# Patient Record
Sex: Female | Born: 1950 | Race: Black or African American | Hispanic: No | Smoking: Former smoker
Health system: Southern US, Community
[De-identification: ages and names within clinical notes are randomized; demographics above are authoritative.]

## PROBLEM LIST (undated history)

## (undated) DIAGNOSIS — D869 Sarcoidosis, unspecified: Secondary | ICD-10-CM

## (undated) DIAGNOSIS — E7800 Pure hypercholesterolemia, unspecified: Secondary | ICD-10-CM

## (undated) DIAGNOSIS — T7840XA Allergy, unspecified, initial encounter: Secondary | ICD-10-CM

## (undated) DIAGNOSIS — J939 Pneumothorax, unspecified: Secondary | ICD-10-CM

## (undated) HISTORY — DX: Allergy, unspecified, initial encounter: T78.40XA

## (undated) HISTORY — PX: LUNG SURGERY: SHX703

## (undated) HISTORY — DX: Pure hypercholesterolemia, unspecified: E78.00

## (undated) HISTORY — PX: TUBAL LIGATION: SHX77

---

## 2001-02-06 ENCOUNTER — Emergency Department (HOSPITAL_COMMUNITY): Admission: EM | Admit: 2001-02-06 | Discharge: 2001-02-06 | Payer: Self-pay | Admitting: Emergency Medicine

## 2002-02-25 ENCOUNTER — Emergency Department (HOSPITAL_COMMUNITY): Admission: EM | Admit: 2002-02-25 | Discharge: 2002-02-25 | Payer: Self-pay | Admitting: Emergency Medicine

## 2002-02-25 ENCOUNTER — Encounter: Payer: Self-pay | Admitting: Emergency Medicine

## 2004-07-26 ENCOUNTER — Other Ambulatory Visit: Admission: RE | Admit: 2004-07-26 | Discharge: 2004-07-26 | Payer: Self-pay | Admitting: Family Medicine

## 2004-08-26 ENCOUNTER — Encounter: Admission: RE | Admit: 2004-08-26 | Discharge: 2004-08-26 | Payer: Self-pay | Admitting: Family Medicine

## 2007-05-31 ENCOUNTER — Emergency Department (HOSPITAL_COMMUNITY): Admission: EM | Admit: 2007-05-31 | Discharge: 2007-05-31 | Payer: Self-pay | Admitting: Family Medicine

## 2007-06-03 ENCOUNTER — Emergency Department (HOSPITAL_COMMUNITY): Admission: EM | Admit: 2007-06-03 | Discharge: 2007-06-03 | Payer: Self-pay | Admitting: Family Medicine

## 2009-05-30 ENCOUNTER — Emergency Department (HOSPITAL_COMMUNITY): Admission: EM | Admit: 2009-05-30 | Discharge: 2009-05-30 | Payer: Self-pay | Admitting: Emergency Medicine

## 2009-07-26 ENCOUNTER — Emergency Department (HOSPITAL_COMMUNITY): Admission: EM | Admit: 2009-07-26 | Discharge: 2009-07-26 | Payer: Self-pay | Admitting: Family Medicine

## 2009-12-05 ENCOUNTER — Emergency Department (HOSPITAL_COMMUNITY): Admission: EM | Admit: 2009-12-05 | Discharge: 2009-12-05 | Payer: Self-pay | Admitting: Family Medicine

## 2009-12-05 ENCOUNTER — Emergency Department (HOSPITAL_COMMUNITY): Admission: EM | Admit: 2009-12-05 | Discharge: 2009-12-05 | Payer: Self-pay | Admitting: Emergency Medicine

## 2009-12-10 ENCOUNTER — Emergency Department (HOSPITAL_COMMUNITY): Admission: EM | Admit: 2009-12-10 | Discharge: 2009-12-11 | Payer: Self-pay | Admitting: Emergency Medicine

## 2009-12-13 ENCOUNTER — Emergency Department (HOSPITAL_COMMUNITY): Admission: EM | Admit: 2009-12-13 | Discharge: 2009-12-13 | Payer: Self-pay | Admitting: Family Medicine

## 2009-12-16 ENCOUNTER — Emergency Department (HOSPITAL_COMMUNITY): Admission: EM | Admit: 2009-12-16 | Discharge: 2009-12-16 | Payer: Self-pay | Admitting: Family Medicine

## 2010-11-30 LAB — URINALYSIS, ROUTINE W REFLEX MICROSCOPIC
Glucose, UA: NEGATIVE mg/dL
Ketones, ur: NEGATIVE mg/dL
Leukocytes, UA: NEGATIVE
Protein, ur: NEGATIVE mg/dL
Urobilinogen, UA: 0.2 mg/dL (ref 0.0–1.0)
pH: 5 (ref 5.0–8.0)

## 2010-11-30 LAB — CBC
HCT: 34.8 % — ABNORMAL LOW (ref 36.0–46.0)
Hemoglobin: 12 g/dL (ref 12.0–15.0)
Platelets: 177 10*3/uL (ref 150–400)
RBC: 3.73 MIL/uL — ABNORMAL LOW (ref 3.87–5.11)
WBC: 6.3 10*3/uL (ref 4.0–10.5)

## 2010-11-30 LAB — COMPREHENSIVE METABOLIC PANEL
AST: 17 U/L (ref 0–37)
BUN: 13 mg/dL (ref 6–23)
CO2: 27 mEq/L (ref 19–32)
Calcium: 9.7 mg/dL (ref 8.4–10.5)
Chloride: 113 mEq/L — ABNORMAL HIGH (ref 96–112)
Creatinine, Ser: 0.77 mg/dL (ref 0.4–1.2)
GFR calc Af Amer: >60 mL/min (ref >60)
GFR calc non Af Amer: >60 mL/min (ref >60)
Glucose, Bld: 94 mg/dL (ref 70–99)

## 2010-11-30 LAB — POCT I-STAT, CHEM 8
BUN: 16 mg/dL (ref 6–23)
Chloride: 111 mEq/L (ref 96–112)
Glucose, Bld: 93 mg/dL (ref 70–99)
HCT: 36 % (ref 36.0–46.0)
Hemoglobin: 12.2 g/dL (ref 12.0–15.0)
Potassium: 4 mEq/L (ref 3.5–5.1)
Sodium: 143 mEq/L (ref 135–145)

## 2010-11-30 LAB — DIFFERENTIAL
Basophils Relative: 1 % (ref 0–1)
Neutro Abs: 3.2 10*3/uL (ref 1.7–7.7)

## 2010-11-30 LAB — URINE MICROSCOPIC-ADD ON

## 2010-12-05 LAB — URINALYSIS, ROUTINE W REFLEX MICROSCOPIC
Glucose, UA: NEGATIVE mg/dL
Specific Gravity, Urine: 1.008 (ref 1.005–1.030)

## 2010-12-05 LAB — COMPREHENSIVE METABOLIC PANEL
ALT: 14 U/L (ref 0–35)
Alkaline Phosphatase: 73 U/L (ref 39–117)
CO2: 27 mEq/L (ref 19–32)
Chloride: 105 mEq/L (ref 96–112)
Creatinine, Ser: 0.73 mg/dL (ref 0.4–1.2)
Sodium: 141 mEq/L (ref 135–145)
Total Bilirubin: 1 mg/dL (ref 0.3–1.2)
Total Protein: 7 g/dL (ref 6.0–8.3)

## 2010-12-05 LAB — URINE MICROSCOPIC-ADD ON

## 2010-12-05 LAB — CBC
Platelets: 203 10*3/uL (ref 150–400)
RBC: 4.17 MIL/uL (ref 3.87–5.11)
RDW: 14.1 % (ref 11.5–15.5)

## 2010-12-05 LAB — GLUCOSE, CAPILLARY

## 2015-07-20 ENCOUNTER — Encounter (HOSPITAL_COMMUNITY): Payer: Self-pay | Admitting: Emergency Medicine

## 2015-07-20 ENCOUNTER — Emergency Department (INDEPENDENT_AMBULATORY_CARE_PROVIDER_SITE_OTHER): Payer: Self-pay

## 2015-07-20 ENCOUNTER — Emergency Department (HOSPITAL_COMMUNITY)
Admission: EM | Admit: 2015-07-20 | Discharge: 2015-07-20 | Disposition: A | Discharge status: Home / Self Care | Payer: Self-pay | Source: Home / Self Care | Attending: Emergency Medicine | Admitting: Emergency Medicine

## 2015-07-20 DIAGNOSIS — J40 Bronchitis, not specified as acute or chronic: Secondary | ICD-10-CM

## 2015-07-20 HISTORY — DX: Pneumothorax, unspecified: J93.9

## 2015-07-20 HISTORY — DX: Sarcoidosis, unspecified: D86.9

## 2015-07-20 MED ORDER — AZITHROMYCIN 250 MG PO TABS: 250.0000 mg | ORAL_TABLET | Freq: Every day | ORAL | Status: DC

## 2015-07-20 MED ORDER — PREDNISONE 50 MG PO TABS: 50.0000 mg | ORAL_TABLET | Freq: Every day | ORAL | Status: DC

## 2015-07-20 MED ORDER — ALBUTEROL SULFATE HFA 108 (90 BASE) MCG/ACT IN AERS: 1.0000 | INHALATION_SPRAY | Freq: Four times a day (QID) | RESPIRATORY_TRACT | Status: DC | PRN

## 2015-07-20 MED ORDER — HYDROCOD POLST-CPM POLST ER 10-8 MG/5ML PO SUER: 5.0000 mL | Freq: Two times a day (BID) | ORAL | Status: DC | PRN

## 2015-07-20 NOTE — ED Notes (Signed)
Complains of chest congestion, phlegm is brown tinged.  Patient complains of deep cough with burning in chest, burning worse with coughing, complains of headache, hot and cold spells and feels very tired.

## 2015-07-20 NOTE — ED Provider Notes (Signed)
HPI  SUBJECTIVE:  Deanna Knight is a 64 y.o. female who presents with cough, chest congestion, nasal congestion, postnasal drip, fatigue, chest soreness from coughing, wheezing, shortness of breath due to coughing for one week. States the cough is productive of brownish sputum. Symptoms are better with rest, worse with lying down and she has tried Mucinex and DayQuil. She denies nausea, vomiting, fevers although she reports feeling feverish with lightheaded. She states that she is coughing all night unable to sleep. States that she is getting worse, not better. She denies sinus pain/pressure. She reports left ear pain" popping". She just finished amoxicillin one week ago for dental problem. She did not get a flu shot this year. Past medical history sarcoidosis, bronchitis, pneumothorax. She is a smoker. No history of diabetes, asthma, emphysema, COPD, hypertension.    Past Medical History  Diagnosis Date  . Sarcoidosis ([place])   . Pneumothorax     Past Surgical History  Procedure Laterality Date  . Lung surgery      No family history on file.  Social History  Substance Use Topics  . Smoking status: Current Every Day Smoker  . Smokeless tobacco: None  . Alcohol Use: Yes    No current facility-administered medications for this encounter.  Current outpatient prescriptions:  .  albuterol (PROVENTIL HFA;VENTOLIN HFA) 108 (90 BASE) MCG/ACT inhaler, Inhale 1-2 puffs into the lungs every 6 (six) hours as needed for wheezing or shortness of breath. Dispense with aerochamber, Disp: 1 Inhaler, Rfl: 0 .  azithromycin (ZITHROMAX) 250 MG tablet, Take 1 tablet (250 mg total) by mouth daily. 2 tabs po on day one, then one tablet po once daily on days 2-5., Disp: 6 tablet, Rfl: 0 .  chlorpheniramine-HYDROcodone (TUSSIONEX PENNKINETIC ER) 10-8 MG/5ML SUER, Take 5 mLs by mouth every 12 (twelve) hours as needed for cough., Disp: 120 mL, Rfl: 0 .  predniSONE (DELTASONE) 50 MG tablet, Take 1  tablet (50 mg total) by mouth daily with breakfast. X 5 days, Disp: 5 tablet, Rfl: 0  Allergies  Allergen Reactions  . Sulfa Antibiotics      ROS  As noted in HPI.   Physical Exam  BP 135/88 mmHg  Pulse 69  Temp(Src) 99 F (37.2 C) (Oral)  Resp 18  SpO2 97%  Constitutional: Well developed, well nourished, no acute distress Eyes:  EOMI, conjunctiva normal bilaterally HENT: Normocephalic, atraumatic,mucus membranes moist. Bilateral TMs normal. Large cerumen plug left ear. Hearing grossly normal bilaterally. Positive nasal congestion, erythematous swollen turbinates, no sinus tenderness. Normal oropharynx, + postnasal drip. Respiratory: Normal inspiratory effort good air movement no rales wheezing rhonchi Cardiovascular: Normal rate, regular rhythm no murmurs rubs gallops GI: nondistended skin: No rash, skin intact Musculoskeletal: no deformities Neurologic: Alert & oriented x 3, no focal neuro deficits Psychiatric: Speech and behavior appropriate   ED Course   Medications - No data to display  Orders Placed This Encounter  Procedures  . DG Chest 2 View    Standing Status: Standing     Number of Occurrences: 1     Standing Expiration Date:     Order Specific Question:  Reason for Exam (SYMPTOM  OR DIAGNOSIS REQUIRED)    Answer:  cough x 1 week h/o sarcoidosis, ptx r/o PNA, ptx, etc  . DG Chest 1 View    Standing Status: Standing     Number of Occurrences: 1     Standing Expiration Date:     Order Specific Question:  Reason for Exam (SYMPTOM  OR DIAGNOSIS REQUIRED)    Answer:  mass R upper chest - repeat xr    No results found for this or any previous visit (from the past 24 hour(s)). Dg Chest 1 View  07/20/2015  CLINICAL DATA:  Exclude hair artifact. EXAM: CHEST 1 VIEW COMPARISON:  07/20/2015 at 17:31 p.m. FINDINGS: Lungs are clear. Cardiomediastinal silhouette is within normal. Previously noted opacity overlying the posterior right first rib/ right neck base is  no longer present and was related to patient's hair. IMPRESSION: No active disease. Electronically Signed   By: Marin Olp M.D.   On: 07/20/2015 19:18   Dg Chest 2 View  07/20/2015  CLINICAL DATA:  Chest pain and burning for 1 week cough shortness of breath EXAM: CHEST  2 VIEW COMPARISON:  12/05/2009 FINDINGS: The heart size and vascular pattern are normal. Lungs are clear. No pleural effusion. There is hyper attenuating 2 cm focus of irregularity projecting over the posterior right first rib. IMPRESSION: Abnormal hyper attenuating possibly calcified lesion projecting over posterior right first rib. Differential diagnostic possibilities include callus from prior rib fracture, mass involving the ribs or chest wall, or possibly extraneous object on the patient's skin. Hair projects over the patient's more cranial cervical soft tissues. Recommend repeating the radiograph with attention to the patient's hair. If this finding persists, CT thorax would be suggested. Electronically Signed   By: Skipper Cliche M.D.   On: 07/20/2015 18:00    ED Clinical Impression  Bronchitis   ED Assessment/Plan  Upper right chest Mass noted on initial x-ray. Repeated x-ray, no mass noted. No pneumonia, pneumothorax. See radiology report for details.  Feel that left ear popping is from cerumen impaction- no evidence of otitis-  advised Debrox, peroxide/warm water irrigation. Home with albuterol and prednisone, Tussionex, wait-and-see prescription azithromycin if she does not get better after finishing the prednisone. Giving antibiotics as patient has history of structural lung disease and duration of symptoms. Discussed imaging, MDM, plan and followup with patient. Discussed sn/sx that should prompt return to the UC or ED. Patient agrees with plan.   *This clinic note was created using Dragon dictation software. Therefore, there may be occasional mistakes despite careful proofreading.  ?   Melynda Ripple,  MD 07/20/15 1924

## 2015-07-20 NOTE — Discharge Instructions (Signed)
Due to puffs from your albuterol inhaler every 4-6 hours as needed for cough. Continue the Mucinex to help keep mucous secretions thin. Cough syrup as needed for cough. Finish the steroids. Start the azithromycin if you're not getting better after finishing the steroids or if you start running fevers above 100.4. Be sure to buy a thermometer to monitor your temperature at home

## 2016-09-19 ENCOUNTER — Emergency Department (HOSPITAL_COMMUNITY)
Admission: EM | Admit: 2016-09-19 | Discharge: 2016-09-19 | Disposition: A | Discharge status: Home / Self Care | Payer: Medicare Other | Attending: Emergency Medicine | Admitting: Emergency Medicine

## 2016-09-19 ENCOUNTER — Encounter (HOSPITAL_COMMUNITY): Payer: Self-pay | Admitting: Emergency Medicine

## 2016-09-19 ENCOUNTER — Emergency Department (HOSPITAL_COMMUNITY): Payer: Medicare Other

## 2016-09-19 DIAGNOSIS — R05 Cough: Secondary | ICD-10-CM | POA: Diagnosis not present

## 2016-09-19 DIAGNOSIS — R0789 Other chest pain: Secondary | ICD-10-CM | POA: Insufficient documentation

## 2016-09-19 DIAGNOSIS — F17200 Nicotine dependence, unspecified, uncomplicated: Secondary | ICD-10-CM | POA: Diagnosis not present

## 2016-09-19 DIAGNOSIS — R059 Cough, unspecified: Secondary | ICD-10-CM

## 2016-09-19 DIAGNOSIS — Z79899 Other long term (current) drug therapy: Secondary | ICD-10-CM | POA: Insufficient documentation

## 2016-09-19 DIAGNOSIS — R079 Chest pain, unspecified: Secondary | ICD-10-CM | POA: Diagnosis not present

## 2016-09-19 LAB — BASIC METABOLIC PANEL
Anion gap: 8 (ref 5–15)
BUN: 15 mg/dL (ref 6–20)
CALCIUM: 9.6 mg/dL (ref 8.9–10.3)
CO2: 22 mmol/L (ref 22–32)
CREATININE: 0.7 mg/dL (ref 0.44–1.00)
Chloride: 108 mmol/L (ref 101–111)
GFR calc Af Amer: >60 mL/min (ref >60)
GLUCOSE: 95 mg/dL (ref 65–99)
POTASSIUM: 3.7 mmol/L (ref 3.5–5.1)
SODIUM: 138 mmol/L (ref 135–145)

## 2016-09-19 LAB — CBC
HEMATOCRIT: 43.3 % (ref 36.0–46.0)
Hemoglobin: 14.5 g/dL (ref 12.0–15.0)
MCH: 31.5 pg (ref 26.0–34.0)
MCHC: 33.5 g/dL (ref 30.0–36.0)
MCV: 94.1 fL (ref 78.0–100.0)
PLATELETS: 219 10*3/uL (ref 150–400)
RBC: 4.6 MIL/uL (ref 3.87–5.11)
RDW: 13.5 % (ref 11.5–15.5)
WBC: 6.9 10*3/uL (ref 4.0–10.5)

## 2016-09-19 LAB — I-STAT TROPONIN, ED: TROPONIN I, POC: 0 ng/mL (ref 0.00–0.08)

## 2016-09-19 MED ORDER — PREDNISONE 20 MG PO TABS
60.0000 mg | ORAL_TABLET | ORAL | Status: AC
  Administered 2016-09-19: 60 mg via ORAL
  Filled 2016-09-19: qty 3

## 2016-09-19 MED ORDER — ALBUTEROL SULFATE HFA 108 (90 BASE) MCG/ACT IN AERS
2.0000 | INHALATION_SPRAY | Freq: Four times a day (QID) | RESPIRATORY_TRACT | Status: DC
  Administered 2016-09-19: 2 via RESPIRATORY_TRACT
  Filled 2016-09-19: qty 6.7

## 2016-09-19 MED ORDER — PREDNISONE 20 MG PO TABS: 40.0000 mg | ORAL_TABLET | Freq: Every day | ORAL | 0 refills | Status: DC

## 2016-09-19 NOTE — Discharge Instructions (Signed)
As discussed, your evaluation today has been largely reassuring.  But, it is important that you monitor your condition carefully, and do not hesitate to return to the ED if you develop new, or concerning changes in your condition.  Otherwise, please follow-up with your physician for appropriate ongoing care.  You symptoms are likely due to irritation of the chest wall, possibly bronchitis.  Please take prednisone as prescribed and use the provided inhaler every four hours for the next two days (you may use it as needed after that).

## 2016-09-19 NOTE — ED Triage Notes (Signed)
Pt c/o burning chest pain since Friday, also c/o cough.

## 2016-09-19 NOTE — Discharge Planning (Signed)
Pt up for discharge. EDCM reviewed chart for possible CM needs.  No needs identified or communicated.  

## 2016-09-19 NOTE — ED Provider Notes (Signed)
[facility] DEPT Provider Note   CSN: [id number] Arrival date & time: 09/19/16  [id number]  By signing my name below, I, Soijett Blue, attest that this documentation has been prepared under the direction and in the presence of Carmin Muskrat, MD. Electronically Signed: Soijett Blue, ED Scribe. 09/19/16. 1:00 PM.  History   Chief Complaint Chief Complaint  Patient presents with  . Chest Pain    HPI Deanna Knight is a 66 y.o. female with a PMHx of pneumothorax, sarcoidosis, who presents to the Emergency Department complaining of non-exertional, burning, right-sided, CP onset 4 days ago. Pt notes that she was sick as well as she took down her christmas decorations prior to the onset of her symptoms. She is having associated symptoms of exertional SOB, productive cough, nausea, and loose stools. She has tried albuterol inhaler, ASA, theraflu, and mucinex, with no relief for her symptoms. She denies fever, vomiting, diarrhea, and any other symptoms. Pt notes that she used to smoke and notes that she is still around second-hand smoke. 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 Denies having a PCP.   The history is provided by the patient. No language interpreter was used.    Past Medical History:  Diagnosis Date  . Pneumothorax   . Sarcoidosis ([place])     There are no active problems to display for this patient.   Past Surgical History:  Procedure Laterality Date  . LUNG SURGERY      OB History    No data available       Home Medications    Prior to Admission medications   Medication Sig Start Date End Date Taking? Authorizing Provider  albuterol (PROVENTIL HFA;VENTOLIN HFA) 108 (90 BASE) MCG/ACT inhaler Inhale 1-2 puffs into the lungs every 6 (six) hours as needed for wheezing or shortness of breath. Dispense with aerochamber 07/20/15   Melynda Ripple, MD  azithromycin (ZITHROMAX) 250 MG tablet Take 1 tablet (250 mg total) by mouth daily. 2 tabs po on day one, then  one tablet po once daily on days 2-5. 07/20/15   Melynda Ripple, MD  chlorpheniramine-HYDROcodone [facility] ER) 10-8 MG/5ML SUER Take 5 mLs by mouth every 12 (twelve) hours as needed for cough. 07/20/15   Melynda Ripple, MD  predniSONE (DELTASONE) 50 MG tablet Take 1 tablet (50 mg total) by mouth daily with breakfast. X 5 days 07/20/15   Melynda Ripple, MD    Family History No family history on file.  Social History Social History  Substance Use Topics  . Smoking status: Current Every Day Smoker  . Smokeless tobacco: Not on file  . Alcohol use Yes     Allergies   Sulfa antibiotics   Review of Systems Review of Systems  Constitutional:       Per HPI, otherwise negative  HENT:       Per HPI, otherwise negative  Respiratory:       Per HPI, otherwise negative  Cardiovascular: Positive for chest pain.       Per HPI, otherwise negative  Gastrointestinal: Negative for vomiting.  Endocrine:       Negative aside from HPI  Genitourinary:       Neg aside from HPI   Musculoskeletal:       Per HPI, otherwise negative  Skin: Negative.   Neurological: Negative for syncope.     Physical Exam Updated Vital Signs BP 125/75 (BP Location: Right Arm)   Pulse 90   Temp 98.4 F (36.9 C) (Oral)  Resp 18   Ht 5\' 7"  (1.702 m)   Wt 135 lb (61.2 kg)   SpO2 94%   BMI 21.14 kg/m   Physical Exam  Constitutional: She is oriented to person, place, and time. She appears well-developed and well-nourished. No distress.  HENT:  Head: Normocephalic and atraumatic.  Eyes: Conjunctivae and EOM are normal.  Cardiovascular: Normal rate and regular rhythm.   Pulmonary/Chest: Effort normal. No stridor. No respiratory distress.  Diminished breath sounds bilaterally  Abdominal: She exhibits no distension.  Musculoskeletal: She exhibits no edema.  Neurological: She is alert and oriented to person, place, and time. No cranial nerve deficit.  Skin: Skin is warm and dry.    Psychiatric: She has a normal mood and affect.  Nursing note and vitals reviewed.   ED Treatments / Results  DIAGNOSTIC STUDIES: Oxygen Saturation is 94% on RA, adequate by my interpretation.    COORDINATION OF CARE: 12:46 PM Discussed treatment plan with pt at bedside which includes labs, CXR, EKG, steroid Rx, albuterol inhaler, consult to case management, and pt agreed to plan.   Labs (all labs ordered are listed, but only abnormal results are displayed) Labs Reviewed  BASIC METABOLIC PANEL  CBC  I-STAT [place], ED    EKG  EKG Interpretation  Date/Time:  Tuesday September 19 2016 08:44:42 EST Ventricular Rate:  67 PR Interval:  112 QRS Duration: 78 QT Interval:  388 QTC Calculation: 409 R Axis:   79 Text Interpretation:  Normal sinus rhythm ST-t wave abnormality Baseline wander Abnormal ekg Confirmed by Carmin Muskrat  MD [phone]) on 09/19/2016 12:28:37 PM       Radiology Dg Chest 2 View  Result Date: 09/19/2016 CLINICAL DATA:  Chest pain several days EXAM: CHEST  2 VIEW COMPARISON:  07/20/2015 FINDINGS: The heart size and mediastinal contours are within normal limits. Both lungs are clear. The visualized skeletal structures are unremarkable. IMPRESSION: No active cardiopulmonary disease. Electronically Signed   By: Inez Catalina M.D.   On: 09/19/2016 09:26    Procedures Procedures (including critical care time)  Medications Ordered in ED Medications  albuterol (PROVENTIL HFA;VENTOLIN HFA) 108 (90 Base) MCG/ACT inhaler 2 puff (not administered)  predniSONE (DELTASONE) tablet 60 mg (not administered)    Initial Impression / Assessment and Plan / ED Course  I have reviewed the triage vital signs and the nursing notes.  Pertinent labs & imaging results that were available during my care of the patient were reviewed by me and considered in my medical decision making (see chart for details).  Clinical Course    Well appearing F p/w days of cough, sternal and  R-sided CP.  No e/o ACS, PNA, PTX, and no VS c/w PE.  Sx may be 2/2 bronchitis or musculoskeletal etiology.  Patient started on steroids, scheduled albuterol (former smoker) and d/w CM to provided f/u appt.    I personally performed the services described in this documentation, which was scribed in my presence. The recorded information has been reviewed and is accurate.      Carmin Muskrat, MD 09/19/16 1322

## 2016-10-11 ENCOUNTER — Ambulatory Visit (INDEPENDENT_AMBULATORY_CARE_PROVIDER_SITE_OTHER): Payer: Medicare Other | Admitting: Family Medicine

## 2016-10-11 ENCOUNTER — Encounter: Payer: Self-pay | Admitting: Family Medicine

## 2016-10-11 VITALS — BP 131/65 | HR 66 | Temp 97.5°F | Resp 18 | Ht 67.0 in | Wt 140.0 lb

## 2016-10-11 DIAGNOSIS — Z114 Encounter for screening for human immunodeficiency virus [HIV]: Secondary | ICD-10-CM | POA: Diagnosis not present

## 2016-10-11 DIAGNOSIS — Z1231 Encounter for screening mammogram for malignant neoplasm of breast: Secondary | ICD-10-CM

## 2016-10-11 DIAGNOSIS — Z1211 Encounter for screening for malignant neoplasm of colon: Secondary | ICD-10-CM

## 2016-10-11 DIAGNOSIS — E7800 Pure hypercholesterolemia, unspecified: Secondary | ICD-10-CM

## 2016-10-11 DIAGNOSIS — H538 Other visual disturbances: Secondary | ICD-10-CM

## 2016-10-11 DIAGNOSIS — R634 Abnormal weight loss: Secondary | ICD-10-CM

## 2016-10-11 DIAGNOSIS — Z1159 Encounter for screening for other viral diseases: Secondary | ICD-10-CM

## 2016-10-11 DIAGNOSIS — Z23 Encounter for immunization: Secondary | ICD-10-CM | POA: Diagnosis not present

## 2016-10-11 DIAGNOSIS — T7840XS Allergy, unspecified, sequela: Secondary | ICD-10-CM

## 2016-10-11 DIAGNOSIS — R739 Hyperglycemia, unspecified: Secondary | ICD-10-CM | POA: Diagnosis not present

## 2016-10-11 DIAGNOSIS — Z1239 Encounter for other screening for malignant neoplasm of breast: Secondary | ICD-10-CM

## 2016-10-11 LAB — CBC WITH DIFFERENTIAL/PLATELET
BASOS PCT: 0 %
Basophils Absolute: 0 cells/uL (ref 0–200)
EOS ABS: 116 {cells}/uL (ref 15–500)
Eosinophils Relative: 2 %
HCT: 39.2 % (ref 35.0–45.0)
Hemoglobin: 12.8 g/dL (ref 11.7–15.5)
Lymphocytes Relative: 32 %
Lymphs Abs: 1856 cells/uL (ref 850–3900)
MCH: 30.9 pg (ref 27.0–33.0)
MCHC: 32.7 g/dL (ref 32.0–36.0)
MCV: 94.7 fL (ref 80.0–100.0)
MONOS PCT: 5 %
MPV: 11.1 fL (ref 7.5–12.5)
Monocytes Absolute: 290 cells/uL (ref 200–950)
NEUTROS ABS: 3538 {cells}/uL (ref 1500–7800)
Neutrophils Relative %: 61 %
PLATELETS: 207 10*3/uL (ref 140–400)
RBC: 4.14 MIL/uL (ref 3.80–5.10)
RDW: 14.1 % (ref 11.0–15.0)
WBC: 5.8 10*3/uL (ref 3.8–10.8)

## 2016-10-11 LAB — COMPLETE METABOLIC PANEL WITH GFR
ALT: 17 U/L (ref 6–29)
AST: 19 U/L (ref 10–35)
Albumin: 3.8 g/dL (ref 3.6–5.1)
Alkaline Phosphatase: 68 U/L (ref 33–130)
BUN: 14 mg/dL (ref 7–25)
CO2: 24 mmol/L (ref 20–31)
Calcium: 9.2 mg/dL (ref 8.6–10.4)
Chloride: 110 mmol/L (ref 98–110)
Creat: 0.77 mg/dL (ref 0.50–0.99)
GFR, EST NON AFRICAN AMERICAN: 81 mL/min (ref >=60)
Glucose, Bld: 79 mg/dL (ref 65–99)
Potassium: 4.2 mmol/L (ref 3.5–5.3)
Sodium: 144 mmol/L (ref 135–146)
Total Bilirubin: 0.3 mg/dL (ref 0.2–1.2)
Total Protein: 6.6 g/dL (ref 6.1–8.1)

## 2016-10-11 LAB — POCT GLYCOSYLATED HEMOGLOBIN (HGB A1C): HEMOGLOBIN A1C: 5.2

## 2016-10-11 MED ORDER — ZOSTER VACCINE LIVE 19400 UNT/0.65ML ~~LOC~~ SUSR: 0.6500 mL | Freq: Once | SUBCUTANEOUS | 0 refills | Status: AC

## 2016-10-11 MED ORDER — CETIRIZINE HCL 10 MG PO TABS: 10.0000 mg | ORAL_TABLET | Freq: Every day | ORAL | 11 refills | Status: DC

## 2016-10-11 NOTE — Progress Notes (Signed)
Subjective:    Patient ID: Deanna Knight, female    DOB: Apr 26, 1951, 66 y.o.   MRN: [id number]  HPI Ms. Deanna Knight, a 66 year old female presents to establish care. She was a patient of [employer], but has been lost to follow up over the past several years. Patient has a history of elevated cholesterol and a family history of type 2 diabetes mellitus. She is following a low fat, low carbohydrate diet. She is very active.   Patient denies chest pain, claudication, dyspnea, fatigue, irregular heart beat, near-syncope, orthopnea, palpitations, syncope and tachypnea.  Cardiovascular risk factors  advanced age (older than 27 for men, 41 for women) and dyslipidemia.   Patient is also complaining of a rash that occurs periodically when she is exposed to chlorine. 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 She says that she gets an itchy rash, primarily to lower extremities when exposed. She denies new lotions, soaps, laundry detergents or new pets. She has not attempted any OTC interventions to assist with problem.  Past Medical History:  Diagnosis Date  . Hypercholesteremia   . Pneumothorax   . Sarcoidosis ([place])    Immunization History  Administered Date(s) Administered  . Influenza,inj,Quad PF,36+ Mos 10/11/2016  . Pneumococcal Conjugate-13 10/11/2016   Review of Systems  Constitutional: Negative.  Negative for fatigue, fever and unexpected weight change.  HENT: Negative.   Eyes: Positive for photophobia and visual disturbance (blurred vision to right eye).  Respiratory: Negative.   Cardiovascular: Negative.   Gastrointestinal: Negative.   Endocrine: Negative.  Negative for polydipsia, polyphagia and polyuria.  Genitourinary: Negative.   Musculoskeletal: Negative.   Skin: Negative.   Allergic/Immunologic: Negative.   Neurological: Negative.   Hematological: Negative.   Psychiatric/Behavioral: Negative.         Objective:   Physical Exam  Constitutional: She is oriented to person, place, and time. She appears well-developed and well-nourished.  HENT:  Head: Normocephalic and atraumatic.  Right Ear: External ear normal.  Left Ear: External ear normal.  Mouth/Throat: Oropharynx is clear and moist.  Eyes: Conjunctivae and EOM are normal. Pupils are equal, round, and reactive to light.  Neck: Normal range of motion. Neck supple.  Cardiovascular: Normal rate, regular rhythm, normal heart sounds and intact distal pulses.   Pulmonary/Chest: Effort normal and breath sounds normal.  Abdominal: Soft. Bowel sounds are normal.  Musculoskeletal: Normal range of motion.  Neurological: She is alert and oriented to person, place, and time. She has normal reflexes.  Skin: Skin is warm and dry.  Psychiatric: She has a normal mood and affect. Her behavior is normal. Judgment and thought content normal.      BP 131/65 (BP Location: Left Arm, Patient Position: Sitting, Cuff Size: Normal)   Pulse 66   Temp 97.5 F (36.4 C)   Resp 18   Ht 5\' 7"  (1.702 m)   Wt 140 lb (63.5 kg)   SpO2 100%   BMI 21.93 kg/m  Assessment & Plan:  1. Immunization due - Pneumococcal conjugate vaccine 13-valent - Zoster Vaccine Live, PF, (ZOSTAVAX) 16109 UNT/0.65ML injection; Inject 19,400 Units into the skin once.  Dispense: 1 each; Refill: 0  2. Hypercholesterolemia - Lipid Panel; Future - COMPLETE METABOLIC PANEL WITH GFR  3. Weight loss Recommend 2000 calorie  diet divided over 5-6 small meals, increase water intake to 6-8 glasses, and 150 minutes per week of cardiovascular exercise.    4. Hyperglycemia -  HgB A1c - COMPLETE METABOLIC PANEL WITH GFR - CBC with Differential  5. Allergic state, sequela - cetirizine (ZYRTEC) 10 MG tablet; Take 1 tablet (10 mg total) by mouth daily.  Dispense: 30 tablet; Refill: 11  6. Need for influenza vaccination - Flu Vaccine QUAD 36+ mos PF IM (Fluarix & Fluzone Quad PF)  7.  Screening for breast cancer - MM Digital Screening; Future  8. Blurred vision, left eye - Ambulatory referral to Ophthalmology  9. Screening for HIV (human immunodeficiency virus) - HIV antibody (with reflex)  10. Colon cancer screening  - Ambulatory referral to Gastroenterology    RTC: 3 months for pap smear   Dorena Dew, FNP

## 2016-10-11 NOTE — Patient Instructions (Addendum)
Allergies, Adult An allergy is when your body's defense system (immune system) overreacts to an otherwise harmless substance (allergen) that you breathe in or eat or something that touches your skin. When you come into contact with something that you are allergic to, your immune system produces certain proteins (antibodies). These proteins cause cells to release chemicals (histamines) that trigger the symptoms of an allergic reaction. Allergies often affect the nasal passages (allergic rhinitis), eyes (allergic conjunctivitis), skin (atopic dermatitis), and stomach. Allergies can be mild or severe. Allergies cannot spread from person to person (are not contagious). They can develop at any age and may be outgrown. What are the causes? Allergies can be caused by any substance that your immune system mistakenly targets as harmful. These may include:  Outdoor allergens, such as pollen, grass, weeds, car exhaust, and mold spores.  Indoor allergens, such as dust, smoke, mold, and pet dander.  Foods, especially peanuts, milk, eggs, fish, shellfish, soy, nuts, and wheat.  Medicines, such as penicillin.  Skin irritants, such as detergents, chemicals, and latex.  Perfume.  Insect bites or stings. What increases the risk? You may be at greater risk of allergies if other people in your family have allergies. What are the signs or symptoms? Symptoms depend on what type of allergy you have. They may include:  Runny, stuffy nose.  Sneezing.  Itchy mouth, ears, or throat.  Postnasal drip.  Sore throat.  Itchy, red, watery, or puffy eyes.  Skin rash or hives.  Stomach pain.  Vomiting.  Diarrhea.  Bloating.  Wheezing or coughing. People with a severe allergy to food, medicine, or an insect bite may have a life-threatening allergic reaction (anaphylaxis). Symptoms of anaphylaxis include:  Hives.  Itching.  Flushed face.  Swollen lips, tongue, or mouth.  Tight or swollen  throat.  Chest pain or tightness in the chest.  Trouble breathing or shortness of breath.  Rapid heartbeat.  Dizziness or fainting.  Vomiting.  Diarrhea.  Pain in the abdomen. How is this diagnosed? This condition is diagnosed based on:  Your symptoms.  Your family and medical history.  A physical exam. You may need to see a health care provider who specializes in treating allergies (allergist). You may also have tests, including:  Skin tests to see which allergens are causing your symptoms, such as:  Skin prick test. In this test, your skin is pricked with a tiny needle and exposed to small amounts of possible allergens to see if your skin reacts.  Intradermal skin test. In this test, a small amount of allergen is injected under your skin to see if your skin reacts.  Patch test. In this test, a small amount of allergen is placed on your skin and then your skin is covered with a bandage. Your health care provider will check your skin after a couple of days to see if a rash has developed.  Blood tests.  Challenges tests. In this test, you inhale a small amount of allergen by mouth to see if you have an allergic reaction. You may also be asked to:  Keep a food diary. A food diary is a record of all the foods and drinks you have in a day and any symptoms you experience.  Practice an elimination diet. An elimination diet involves eliminating specific foods from your diet and then adding them back in one by one to find out if a certain food causes an allergic reaction. How is this treated? Treatment for allergies depends on your symptoms.   Treatment may include:  Cold compresses to soothe itching and swelling.  Eye drops.  Nasal sprays.  Using a saline spray or container (neti pot) to flush out the nose (nasal irrigation). These methods can help clear away mucus and keep the nasal passages moist.  Using a humidifier.  Oral antihistamines or other medicines to block  allergic reaction and inflammation.  Skin creams to treat rashes or itching.  Diet changes to eliminate food allergy triggers.  Repeated exposure to tiny amounts of allergens to build up a tolerance and prevent future allergic reactions (immunotherapy). These include:  Allergy shots.  Oral treatment. This involves taking small doses of an allergen under the tongue (sublingual immunotherapy).  Emergency epinephrine injection (auto-injector) in case of an allergic emergency. This is a self-injectable, pre-measured medicine that must be given within the first few minutes of a serious allergic reaction. Follow these instructions at home:  Avoid known allergens whenever possible.  If you suffer from airborne allergens, wash out your nose daily. You can do this with a saline spray or a neti pot to flush out your nose (nasal irrigation).  Take over-the-counter and prescription medicines only as told by your health care provider.  Keep all follow-up visits as told by your health care provider. This is important.  If you are at risk of a severe allergic reaction (anaphylaxis), keep your auto-injector with you at all times.  If you have ever had anaphylaxis, wear a medical alert bracelet or necklace that states you have a severe allergy. Contact a health care provider if:  Your symptoms do not improve with treatment. Get help right away if:  You have symptoms of anaphylaxis, such as:  Swollen mouth, tongue, or throat.  Pain or tightness in your chest.  Trouble breathing or shortness of breath.  Dizziness or fainting.  Severe abdominal pain, vomiting, or diarrhea. This information is not intended to replace advice given to you by your health care provider. Make sure you discuss any questions you have with your health care provider. Document Released: 11/21/2002 Document Revised: 04/27/2016 Document Reviewed: 03/15/2016 Elsevier Interactive Patient Education  2017 [employer] Having blurred vision means that you cannot see things clearly. Your vision may seem fuzzy or out of focus. Blurred vision is a very common symptom of an eye or vision problem. Blurred vision is often a gradual blur that occurs in one eye or both eyes. There are many causes of blurred vision, including cataracts, macular degeneration, and diabetic retinopathy. Blurred vision can be diagnosed based on your symptoms and a physical exam. Tell your health care provider about any other health problems you have, any recent eye injury, and any prior surgeries. You may need to see a health care provider who specializes in eye problems (ophthalmologist). Your treatment depends on what is causing your blurred vision.  HOME CARE INSTRUCTIONS  Tell your health care provider about any changes in your blurred vision.  Do not drive or operate heavy machinery if your vision is blurry.  Keep all follow-up visits as directed by your health care provider. This is important. SEEK MEDICAL CARE IF:  Your symptoms get worse.  You have new symptoms.  You have trouble seeing at night.  You have trouble seeing up close or far away.  You have trouble noticing the difference between colors. SEEK IMMEDIATE MEDICAL CARE IF:  You have severe eye pain.  You have a severe headache.  You have flashing lights in your field of vision.  You have a sudden change in vision.  You have a sudden loss of vision.  You have vision change after an injury.  You notice drainage coming from your eyes.  You notice a rash around your eyes. This information is not intended to replace advice given to you by your health care provider. Make sure you discuss any questions you have with your health care provider. Document Released: 08/31/2003 Document Revised: 01/12/2015 Document Reviewed: 07/22/2014 Elsevier Interactive Patient Education  2017 [place].  High Cholesterol High cholesterol is a condition  in which the blood has high levels of a white, waxy, fat-like substance (cholesterol). The human body needs small amounts of cholesterol. The liver makes all the cholesterol that the body needs. Extra (excess) cholesterol comes from the food that we eat. Cholesterol is carried from the liver by the blood through the blood vessels. If you have high cholesterol, deposits (plaques) may build up on the walls of your blood vessels (arteries). Plaques make the arteries narrower and stiffer. Cholesterol plaques increase your risk for heart attack and stroke. Work with your health care provider to keep your cholesterol levels in a healthy range. What increases the risk? This condition is more likely to develop in people who:  Eat foods that are high in animal fat (saturated fat) or cholesterol.  Are overweight.  Are not getting enough exercise.  Have a family history of high cholesterol. What are the signs or symptoms? There are no symptoms of this condition. How is this diagnosed? This condition may be diagnosed from the results of a blood test.  If you are older than age 54, your health care provider may check your cholesterol every 4-6 years.  You may be checked more often if you already have high cholesterol or other risk factors for heart disease. The blood test for cholesterol measures:  "Bad" cholesterol (LDL cholesterol). This is the main type of cholesterol that causes heart disease. The desired level for LDL is less than 100.  "Good" cholesterol (HDL cholesterol). This type helps to protect against heart disease by cleaning the arteries and carrying the LDL away. The desired level for HDL is 60 or higher.  Triglycerides. These are fats that the body can store or burn for energy. The desired number for triglycerides is lower than 150.  Total cholesterol. This is a measure of the total amount of cholesterol in your blood, including LDL cholesterol, HDL cholesterol, and triglycerides. A  healthy number is less than 200. How is this treated? This condition is treated with diet changes, lifestyle changes, and medicines. Diet changes  This may include eating more whole grains, fruits, vegetables, nuts, and fish.  This may also include cutting back on red meat and foods that have a lot of added sugar. Lifestyle changes  Changes may include getting at least 40 minutes of aerobic exercise 3 times a week. Aerobic exercises include walking, biking, and swimming. Aerobic exercise along with a healthy diet can help you maintain a healthy weight.  Changes may also include quitting smoking. Medicines  Medicines are usually given if diet and lifestyle changes have failed to reduce your cholesterol to healthy levels.  Your health care provider may prescribe a statin medicine. Statin medicines have been shown to reduce cholesterol, which can reduce the risk of heart disease. Follow these instructions at home: Eating and drinking If told by your health care provider:  Eat chicken (without skin), fish, veal, shellfish, ground [place] breast, and round or loin cuts of  red meat.  Do not eat fried foods or fatty meats, such as hot dogs and salami.  Eat plenty of fruits, such as apples.  Eat plenty of vegetables, such as broccoli, potatoes, and carrots.  Eat beans, peas, and lentils.  Eat grains such as barley, rice, couscous, and bulgur wheat.  Eat pasta without cream sauces.  Use skim or nonfat milk, and eat low-fat or nonfat yogurt and cheeses.  Do not eat or drink whole milk, cream, ice cream, egg yolks, or hard cheeses.  Do not eat stick margarine or tub margarines that contain trans fats (also called partially hydrogenated oils).  Do not eat saturated tropical oils, such as coconut oil and palm oil.  Do not eat cakes, cookies, crackers, or other baked goods that contain trans fats. General instructions  Exercise as directed by your health care provider. Increase your  activity level with activities such as gardening, walking, and taking the stairs.  Take over-the-counter and prescription medicines only as told by your health care provider.  Do not use any products that contain nicotine or tobacco, such as cigarettes and e-cigarettes. If you need help quitting, ask your health care provider.  Keep all follow-up visits as told by your health care provider. This is important. Contact a health care provider if:  You are struggling to maintain a healthy diet or weight.  You need help to start on an exercise program.  You need help to stop smoking. Get help right away if:  You have chest pain.  You have trouble breathing. This information is not intended to replace advice given to you by your health care provider. Make sure you discuss any questions you have with your health care provider. Document Released: 08/28/2005 Document Revised: 03/25/2016 Document Reviewed: 02/26/2016 Elsevier Interactive Patient Education  2017 [employer].

## 2016-10-12 LAB — HIV ANTIBODY (ROUTINE TESTING W REFLEX): HIV 1&2 Ab, 4th Generation: NONREACTIVE

## 2016-10-16 ENCOUNTER — Encounter: Payer: Self-pay | Admitting: Family Medicine

## 2017-01-09 ENCOUNTER — Encounter: Payer: Self-pay | Admitting: Family Medicine

## 2017-01-09 ENCOUNTER — Ambulatory Visit (INDEPENDENT_AMBULATORY_CARE_PROVIDER_SITE_OTHER): Payer: Medicare Other | Admitting: Family Medicine

## 2017-01-09 ENCOUNTER — Other Ambulatory Visit (HOSPITAL_COMMUNITY)
Admission: RE | Admit: 2017-01-09 | Discharge: 2017-01-09 | Disposition: A | Discharge status: Home / Self Care | Payer: Medicare Other | Source: Ambulatory Visit | Attending: Family Medicine | Admitting: Family Medicine

## 2017-01-09 VITALS — BP 107/88 | HR 62 | Temp 97.8°F | Resp 16 | Ht 67.0 in | Wt 137.0 lb

## 2017-01-09 DIAGNOSIS — Z01419 Encounter for gynecological examination (general) (routine) without abnormal findings: Secondary | ICD-10-CM | POA: Diagnosis not present

## 2017-01-09 DIAGNOSIS — Z1231 Encounter for screening mammogram for malignant neoplasm of breast: Secondary | ICD-10-CM | POA: Diagnosis not present

## 2017-01-09 DIAGNOSIS — Z1239 Encounter for other screening for malignant neoplasm of breast: Secondary | ICD-10-CM

## 2017-01-09 LAB — POCT URINALYSIS DIP (DEVICE)
Bilirubin Urine: NEGATIVE
Glucose, UA: NEGATIVE mg/dL
Ketones, ur: NEGATIVE mg/dL
Leukocytes, UA: NEGATIVE
NITRITE: NEGATIVE
PH: 5 (ref 5.0–8.0)
Protein, ur: NEGATIVE mg/dL
Specific Gravity, Urine: 1.025 (ref 1.005–1.030)
UROBILINOGEN UA: 0.2 mg/dL (ref 0.0–1.0)

## 2017-01-09 NOTE — Progress Notes (Addendum)
Subjective:    Patient ID: Deanna Knight, female    DOB: 1951-01-28, 66 y.o.   MRN: [id number]  Gynecologic Exam  The patient's pertinent negatives include no genital itching, genital lesions, genital odor, genital rash, missed menses, vaginal bleeding or vaginal discharge. Pertinent negatives include no fever. She is not sexually active. She uses nothing for contraception. Her past medical history is significant for miscarriage. There is no history of a Cesarean section, an ectopic pregnancy, endometriosis, herpes simplex, metrorrhagia, ovarian cysts, perineal abscess, PID, an STD, a terminated pregnancy or vaginosis.   Past Medical History:  Diagnosis Date  . Hypercholesteremia   . Pneumothorax   . Sarcoidosis    Immunization History  Administered Date(s) Administered  . Influenza,inj,Quad PF,36+ Mos 10/11/2016  . Pneumococcal Conjugate-13 10/11/2016   Social History   Social History  . Marital status: Divorced    Spouse name: N/A  . Number of children: N/A  . Years of education: N/A   Occupational History  . Not on file.   Social History Main Topics  . Smoking status: Former Smoker    Years: 1.00  . Smokeless tobacco: Former Systems developer  . Alcohol use Yes  . Drug use: No  . Sexual activity: No   Other Topics Concern  . Not on file   Social History Narrative  . No narrative on file   Review of Systems  Constitutional: Negative.  Negative for fatigue and fever.  HENT: Negative.   Eyes: Negative.   Respiratory: Negative.   Cardiovascular: Negative.  Negative for chest pain, palpitations and leg swelling.  Gastrointestinal: Negative.   Endocrine: Negative.   Genitourinary: Negative for missed menses and vaginal discharge.  Musculoskeletal: Negative.   Allergic/Immunologic: Negative.   Neurological: Negative.   Hematological: Negative.   Psychiatric/Behavioral: Negative.        Objective:   Physical Exam  Constitutional: She is oriented to person,  place, and time. She appears well-developed and well-nourished.  HENT:  Head: Normocephalic and atraumatic.  Right Ear: External ear normal.  Left Ear: External ear normal.  Nose: Nose normal.  Mouth/Throat: Oropharynx is clear and moist.  Eyes: Conjunctivae and EOM are normal. Pupils are equal, round, and reactive to light.  Neck: Normal range of motion. Neck supple.  Cardiovascular: Normal rate, normal heart sounds and intact distal pulses.   Pulmonary/Chest: Effort normal and breath sounds normal.  Abdominal: Soft. Bowel sounds are normal.  Genitourinary: Vagina normal and uterus normal. There is no rash, tenderness or lesion on the right labia. There is no rash, tenderness or lesion on the left labia. Cervix exhibits friability. Cervix exhibits no motion tenderness. No tenderness or bleeding in the vagina.  Musculoskeletal: Normal range of motion.  Neurological: She is alert and oriented to person, place, and time. She has normal reflexes.  Skin: Skin is warm and dry.  Psychiatric: She has a normal mood and affect. Her behavior is normal. Judgment and thought content normal.     BP 107/88 (BP Location: Right Arm, Patient Position: Sitting, Cuff Size: Normal)   Pulse 62   Temp 97.8 F (36.6 C) (Oral)   Resp 16   Ht 5\' 7"  (1.702 m)   Wt 137 lb (62.1 kg)   SpO2 100%   BMI 21.46 kg/m  Assessment & Plan:  1. Pap test, as part of routine gynecological examination - Cytology - PAP [place]  2. Breast cancer screening Patient to schedule a mammogram at [facility]  RTC: 6 months for medicare wellness exam  Preventative Health Maintenance: Recommend glaucoma screening Mammogram Colonoscopy Dexascan Fasting Lipid panel Patient is up to date with vaccinations  [name]  MSN, FNP-C [facility] [address] [place], [place] [address] [phone]

## 2017-01-09 NOTE — Patient Instructions (Addendum)
Breast Self-Awareness Breast self-awareness means:  Knowing how your breasts look.  Knowing how your breasts feel.  Checking your breasts every month for changes.  Telling your doctor if you notice a change in your breasts. Breast self-awareness allows you to notice a breast problem early while it is still small. How to do a breast self-exam One way to learn what is normal for your breasts and to check for changes is to do a breast self-exam. To do a breast self-exam: Look for Changes   1. Take off all the clothes above your waist. 2. Stand in front of a mirror in a room with good lighting. 3. Put your hands on your hips. 4. Push your hands down. 5. Look at your breasts and nipples in the mirror to see if one breast or nipple looks different than the other. Check to see if:  The shape of one breast is different.  The size of one breast is different.  There are wrinkles, dips, and bumps in one breast and not the other. 6. Look at each breast for changes in your skin, such as:  Redness.  Scaly areas. 7. Look for changes in your nipples, such as:  Liquid around the nipples.  Bleeding.  Dimpling.  Redness.  A change in where the nipples are. Feel for Changes  1. Lie on your back on the floor. 2. Feel each breast. To do this, follow these steps:  Pick a breast to feel.  Put the arm closest to that breast above your head.  Use your other arm to feel the nipple area of your breast. Feel the area with the pads of your three middle fingers by making small circles with your fingers. For the first circle, press lightly. For the second circle, press harder. For the third circle, press even harder.  Keep making circles with your fingers at the light, harder, and even harder pressures as you move down your breast. Stop when you feel your ribs.  Move your fingers a little toward the center of your body.  Start making circles with your fingers again, this time going up until  you reach your collarbone.  Keep making up and down circles until you reach your armpit. Remember to keep using the three pressures.  Feel the other breast in the same way. 3. Sit or stand in the shower or tub. 4. With soapy water on your skin, feel each breast the same way you did in step 2, when you were lying on the floor. Write Down What You Find   After doing the self-exam, write down:  What is normal for each breast.  Any changes you find in each breast.  When you last had your period. How often should I check my breasts? Check your breasts every month. If you are breastfeeding, the best time to check them is after you feed your baby or after you use a breast pump. If you get periods, the best time to check your breasts is 5-7 days after your period is over. When should I see my doctor? See your doctor if you notice:  A change in shape or size of your breasts or nipples.  A change in the skin of your breast or nipples, such as red or scaly skin.  Unusual fluid coming from your nipples.  A lump or thick area that was not there before.  Pain in your breasts.  Anything that concerns you. This information is not intended to replace advice given to  you by your health care provider. Make sure you discuss any questions you have with your health care provider. Document Released: 02/14/2008 Document Revised: 02/03/2016 Document Reviewed: 07/18/2015 Elsevier Interactive Patient Education  2017 [place] Screening for Women A cancer screening is a test or exam that checks for cancer. Your health care provider will recommend specific cancer screenings based on your age, personal history, and family history of cancer. Work with your health care provider to create a cancer screening schedule that protects your health. Why is cancer screening done? Cancer screening is done to look for cancer in the very early stages, before it spreads and becomes harder to treat and before  you would start to notice symptoms. Finding cancer early improves the chances of successful treatment. It may save your life. Who should be screened for cancer? All women should be screened for certain cancers, including breast cancer, cervical cancer, and skin cancer. Your health care provider may recommend screenings for other types of cancer if:  You had cancer before.  You have a family member with cancer.  You have abnormal genes that could increase the risk of cancer.  You have risk factors for certain cancers, such as smoking. When you should be screened for cancer depends on:  Your age.  Your medical history and your family's medical history.  Certain lifestyle factors, such as smoking.  Environmental exposure, such as to asbestos. What are some common cancer screenings? Breast cancer  Breast cancer screening is done with a test that takes images of breast tissue (mammogram). Here are some screening guidelines:  When you are age 77-44. you will be given the choice to start having mammograms.  When you are age 59-54, you should have a mammogram every year.  You may start having mammograms before age 20 if you have risk factors for breast cancer, such as having an immediate family member with breast cancer.  When you are age 89 or older, you should have a mammogram every 1-2 years for as long as you are in good health and have a life expectancy of 10 years or more.  It is important to know what your breasts look and feel like so you can report any changes to your health care provider. Cervical cancer  Cervical cancer screening is done with a Pap test. This testchecks for abnormalities, including the virus that causes cervical cancer (human papillomavirus, or HPV). To perform the test, a health care provider takes a swab of cervical cells during a pelvic exam. Screening for cervical cancer with a Pap test should start at age 64. Here are some screening guidelines:  When you  are age 49-29, you should have a Pap test every 3 years.  When you are age 46-65, you should have a Pap test and HPV test every 5 years or have a Pap test every 3 years.  You may be screened for cervical cancer more often if you have risk factors for cervical cancer.  If your Pap tests are abnormal, you may have an HPV test.  If you have had the HPV vaccine, you will still be screened for cervical cancer and follow normal screening recommendations. You do not need to be screened for cervical cancer if any of the following apply to you:  You are older than age 24 and you have not had a serious cervical precancer or cancer in the last 20 years.  Your cervix and uterus have been removed and you have never had cervical  cancer or precancerous cells. Endometrial cancer  There is no standard screening test for endometrial cancer, but the cancer can be detected with:  A test of a sample of tissue taken from the lining of the uterus (endometrial tissue biopsy).  A vaginal ultrasound.  Pap tests. If you are at increased risk for endometrial cancer, you may need to have these tests more often than normal. You are at increased risk if:  You have a family history of ovarian, uterine, or colon cancer.  You are taking tamoxifen, a drug that is used to treat breast cancer.  You have certain types of colon cancer. If you have reached menopause, it is especially important to talk with your health care provider about any vaginal bleeding or spotting. Screening for endometrial cancer is not recommended for women who do not have symptoms of the cancer, such as vaginal bleeding. Colorectal cancer  Screening for colorectal cancer is recommended starting at age 84 for most women. If you have a family history of colon or rectal cancer or other risk factors, you may need to start having screenings earlier. Talk with your health care provider about which screening test is right for you and how often you should  be screened. Colorectal cancer screening looks for cancer or for growths called polyps that often form before cancer starts. Tests to look for cancer or polyps include:  Colonoscopy or flexible sigmoidoscopy. For these procedures, a flexible tube with a small camera is inserted into the rectum.  CT colonography. This test uses X-rays and a contrast dye to check the colon for polyps. If a polyp is found, you may need to have a colonoscopy so the polyp can be located and removed. Tests to look for cancer in the stool (feces) include:  Guaiac-based fecal occult blood test (FOBT). This test detects blood in stool. It can be done at home with a kit.  Fecal immunochemical test (FIT). This test detects blood in stool. For this test, you will need to collect stool samples at home.  Stool DNA test. This test looks for blood in stool and any changes in DNA that can lead to colon cancer. For this test, you will need to collect a stool sample at home and send it to a lab. Skin cancer  Skin cancer screening is done by checking the skin for unusual moles or spots and any changes in existing moles. Your health care provider should check your skin for signs of skin cancer at every physical exam. You should check your skin every month and tell your health care provider right away if anything looks unusual. Women with a higher-than-normal risk for skin cancer may want to see a skin specialist (dermatologist) for an annual body check. Lung cancer  Lung cancer screening is done with a CT scan that looks for abnormal cells in the lungs. Discuss lung cancer screening with your health care provider if you are 41-4 years old and if any of the following apply to you:  You currently smoke.  You used to smoke heavily.  You have had at least a 30-pack-year smoking history.  You have quit smoking within the past 15 years. If you smoke heavily or if you used to smoke, you may need to be screened every year. Where to  find more information:  [place]: SkinPromotion.no  Centers for Disease Control and Prevention: http://knight-sullivan.biz/  Department of Health and Human Services: BankingDetective.si Contact a health care provider if:  You have concerns about  any signs or symptoms of cancer, such as:  Moles that have an unusual shape or color.  Changes in existing moles.  A sore on your skin that does not heal.  Blood in your stool.  Fatigue that does not go away.  Frequent pain or cramping in your abdomen.  Coughing, or coughing up blood.  Losing weight without trying.  Lumps or other changes in your breasts.  Vaginal bleeding, spotting, or changes in your periods. Summary  Be aware of and watch for signs and symptoms of cancer, especially symptoms of breast cancer, cervical cancer, endometrial cancer, colorectal cancer, skin cancer, and lung cancer.  Early detection of cancer with cancer screening may save your life.  Talk with your health care provider about your specific cancer risks.  Work together with your health care provider to create a cancer screening plan that is right for you. This information is not intended to replace advice given to you by your health care provider. Make sure you discuss any questions you have with your health care provider. Document Released: 05/25/2016 Document Revised: 05/25/2016 Document Reviewed: 05/25/2016 Elsevier Interactive Patient Education  2017 [employer].

## 2017-01-10 LAB — CYTOLOGY - PAP
BACTERIAL VAGINITIS: NEGATIVE
Candida vaginitis: NEGATIVE
Diagnosis: NEGATIVE

## 2017-01-11 ENCOUNTER — Telehealth: Payer: Self-pay

## 2017-01-11 NOTE — Telephone Encounter (Signed)
-----   Message from Dorena Dew, [place] sent at 01/10/2017  9:59 PM EDT ----- Regarding: lab results Please inform patient that pap smear is negative, will not need to repeat per current guidelines.   Thanks ----- Message ----- From: Interface, Lab In Three Zero One Sent: 01/10/2017   4:02 PM To: Dorena Dew, FNP

## 2017-01-11 NOTE — Telephone Encounter (Signed)
Tried to call patient. No answer and voicemail had not been set up. Will Try later. Thanks!

## 2017-01-12 NOTE — Telephone Encounter (Signed)
Called, no answer. Voicemail not set up . Will try later. Thanks!

## 2017-01-15 NOTE — Telephone Encounter (Signed)
Tried to call. No answer and voicemail not set up. Will mail letter tomorrow to advised of normal pap. Thanks!

## 2017-01-16 NOTE — Telephone Encounter (Signed)
I have mailed letter to patient since I have not been able to get in contact with by phone. Thanks!

## 2017-02-16 ENCOUNTER — Ambulatory Visit: Payer: 59 | Admitting: Family Medicine

## 2017-02-26 ENCOUNTER — Ambulatory Visit: Payer: 59 | Admitting: Family Medicine

## 2017-10-05 ENCOUNTER — Other Ambulatory Visit: Payer: Self-pay

## 2017-10-05 ENCOUNTER — Ambulatory Visit (INDEPENDENT_AMBULATORY_CARE_PROVIDER_SITE_OTHER): Payer: Medicare Other

## 2017-10-05 ENCOUNTER — Ambulatory Visit (HOSPITAL_COMMUNITY)
Admission: EM | Admit: 2017-10-05 | Discharge: 2017-10-05 | Disposition: A | Discharge status: Home / Self Care | Payer: Medicare Other | Attending: Family Medicine | Admitting: Family Medicine

## 2017-10-05 ENCOUNTER — Encounter (HOSPITAL_COMMUNITY): Payer: Self-pay | Admitting: Emergency Medicine

## 2017-10-05 DIAGNOSIS — R05 Cough: Secondary | ICD-10-CM | POA: Diagnosis not present

## 2017-10-05 DIAGNOSIS — J40 Bronchitis, not specified as acute or chronic: Secondary | ICD-10-CM

## 2017-10-05 MED ORDER — AMOXICILLIN 875 MG PO TABS: 875.0000 mg | ORAL_TABLET | Freq: Two times a day (BID) | ORAL | 0 refills | Status: DC

## 2017-10-05 MED ORDER — PREDNISONE 20 MG PO TABS: ORAL_TABLET | ORAL | 0 refills | Status: DC

## 2017-10-05 MED ORDER — HYDROCODONE-HOMATROPINE 5-1.5 MG/5ML PO SYRP: 5.0000 mL | ORAL_SOLUTION | Freq: Four times a day (QID) | ORAL | 0 refills | Status: DC | PRN

## 2017-10-05 NOTE — ED Triage Notes (Signed)
Pt c/o coughing x8 days, headache, congestion, ear pain.

## 2017-10-05 NOTE — ED Provider Notes (Signed)
[facility]   [id number] 10/05/17 Arrival Time: 5427   SUBJECTIVE:  Deanna Knight is a 67 y.o. female who presents to the urgent care with complaint of coughing x8 days, headache, congestion, ear pain.   She is a former smoker.  She is also had sarcoidosis and a pneumothorax.  Her symptoms of gotten worse today and been associated with a fever as well.  Patient works in a daycare center  Past Medical History:  Diagnosis Date  . Hypercholesteremia   . Pneumothorax   . Sarcoidosis    Family History  Problem Relation Age of Onset  . Stroke Mother   . Heart disease Mother   . Heart disease Father   . Stroke Sister   . Heart disease Sister   . Stroke Brother   . Heart disease Brother    Social History   Socioeconomic History  . Marital status: Divorced    Spouse name: Not on file  . Number of children: Not on file  . Years of education: Not on file  . Highest education level: Not on file  Social Needs  . Financial resource strain: Not on file  . Food insecurity - worry: Not on file  . Food insecurity - inability: Not on file  . Transportation needs - medical: Not on file  . Transportation needs - non-medical: Not on file  Occupational History  . Not on file  Tobacco Use  . Smoking status: Former Smoker    Years: 1.00  . Smokeless tobacco: Former Network engineer and Sexual Activity  . Alcohol use: Yes  . Drug use: No  . Sexual activity: No  Other Topics Concern  . Not on file  Social History Narrative  . Not on file   No outpatient medications have been marked as taking for the 10/05/17 encounter [facility] Encounter).   Allergies  Allergen Reactions  . Sulfa Antibiotics   . Sulfur Itching and Swelling      ROS: As per HPI, remainder of ROS negative.   OBJECTIVE:   Vitals:   10/05/17 1719 10/05/17 1720  BP:  (!) 125/108  Pulse: 77   Resp: 20   Temp: 99.5 F (37.5 C)   SpO2: 98%      General appearance: alert; no  distress Eyes: PERRL; EOMI; conjunctiva normal HENT: normocephalic; atraumatic; TMs normal, canal normal, external ears normal without trauma; nasal mucosa normal; oral mucosa normal Neck: supple Lungs: bilateral wheezes and ronchi Heart: regular rate and rhythm; no murmur Back: no CVA tenderness Extremities: no cyanosis or edema; symmetrical with no gross deformities Skin: warm and dry Neurologic: normal gait; grossly normal Psychological: alert and cooperative; normal mood and affect      Labs:  Results for orders placed or performed in visit on 01/09/17  POCT urinalysis dip (device)  Result Value Ref Range   Glucose, UA NEGATIVE NEGATIVE mg/dL   Bilirubin Urine NEGATIVE NEGATIVE   Ketones, ur NEGATIVE NEGATIVE mg/dL   Specific Gravity, Urine 1.025 1.005 - 1.030   Hgb urine dipstick MODERATE (A) NEGATIVE   pH 5.0 5.0 - 8.0   Protein, ur NEGATIVE NEGATIVE mg/dL   Urobilinogen, UA 0.2 0.0 - 1.0 mg/dL   Nitrite NEGATIVE NEGATIVE   Leukocytes, UA NEGATIVE NEGATIVE  Cytology - PAP [place]  Result Value Ref Range   Adequacy      Satisfactory for evaluation  endocervical/transformation zone component PRESENT.   Diagnosis      NEGATIVE FOR INTRAEPITHELIAL LESIONS OR MALIGNANCY.  Bacterial vaginitis Negative for Bacterial Vaginitis Microorganisms    Candida vaginitis Negative for Candida species    Material Submitted CervicoVaginal Pap [ThinPrep Imaged]    CYTOLOGY - PAP PAP RESULT     Labs Reviewed - No data to display  Dg Chest 2 View  Result Date: 10/05/2017 CLINICAL DATA:  Cough and fever.  History of sarcoidosis. EXAM: CHEST  2 VIEW COMPARISON:  09/19/2016 FINDINGS: Normal heart size and mediastinal contours. No acute infiltrate or edema. No effusion or pneumothorax. Degenerative disc narrowing in the lower thoracic spine. No acute osseous findings. IMPRESSION: Negative chest. Electronically Signed   By: Monte Fantasia M.D.   On: 10/05/2017 18:12        ASSESSMENT & PLAN:  1. Bronchitis     Meds ordered this encounter  Medications  . predniSONE (DELTASONE) 20 MG tablet    Sig: Two daily with food    Dispense:  10 tablet    Refill:  0  . HYDROcodone-homatropine (HYDROMET) 5-1.5 MG/5ML syrup    Sig: Take 5 mLs by mouth every 6 (six) hours as needed for cough.    Dispense:  60 mL    Refill:  0  . amoxicillin (AMOXIL) 875 MG tablet    Sig: Take 1 tablet (875 mg total) by mouth 2 (two) times daily.    Dispense:  20 tablet    Refill:  0    Reviewed expectations re: course of current medical issues. Questions answered. Outlined signs and symptoms indicating need for more acute intervention. Patient verbalized understanding. After Visit Summary given.    Procedures:      Robyn Haber, MD 10/05/17 0932

## 2017-10-08 ENCOUNTER — Other Ambulatory Visit: Payer: Self-pay | Admitting: Family Medicine

## 2017-10-08 ENCOUNTER — Encounter: Payer: Self-pay | Admitting: Family Medicine

## 2017-10-08 ENCOUNTER — Ambulatory Visit (INDEPENDENT_AMBULATORY_CARE_PROVIDER_SITE_OTHER): Payer: Medicare Other | Admitting: Family Medicine

## 2017-10-08 ENCOUNTER — Encounter (HOSPITAL_COMMUNITY): Payer: Medicare Other

## 2017-10-08 VITALS — BP 138/82 | HR 71 | Temp 98.1°F | Resp 16 | Ht 67.0 in | Wt 138.0 lb

## 2017-10-08 DIAGNOSIS — Z1211 Encounter for screening for malignant neoplasm of colon: Secondary | ICD-10-CM

## 2017-10-08 DIAGNOSIS — Z0000 Encounter for general adult medical examination without abnormal findings: Secondary | ICD-10-CM

## 2017-10-08 DIAGNOSIS — Z1239 Encounter for other screening for malignant neoplasm of breast: Secondary | ICD-10-CM

## 2017-10-08 DIAGNOSIS — J40 Bronchitis, not specified as acute or chronic: Secondary | ICD-10-CM

## 2017-10-08 DIAGNOSIS — Z1231 Encounter for screening mammogram for malignant neoplasm of breast: Secondary | ICD-10-CM | POA: Diagnosis not present

## 2017-10-08 MED ORDER — ALBUTEROL SULFATE HFA 108 (90 BASE) MCG/ACT IN AERS: 2.0000 | INHALATION_SPRAY | Freq: Four times a day (QID) | RESPIRATORY_TRACT | 0 refills | Status: DC | PRN

## 2017-10-08 NOTE — Patient Instructions (Addendum)
Deanna Knight , Thank you for taking time to come for your Medicare Wellness Visit. I appreciate your ongoing commitment to your health goals. Please review the following plan we discussed and let me know if I can assist you in the future.   These are the goals we discussed: Goals    None      This is a list of the screening recommended for you and due dates:  Health Maintenance  Topic Date Due  . Mammogram  02/21/2001  . Colon Cancer Screening  02/21/2001  . DEXA scan (bone density measurement)  02/22/2016  . Pneumonia vaccines (2 of 2 - PPSV23) 10/11/2017  . Tetanus Vaccine  03/11/2024  .  Hepatitis C: One time screening is recommended by Center for Disease Control  (CDC) for  adults born from [birth year] through 1965.   Addressed   Preventing Disease Through Immunization Immunization means developing a lower risk of getting a disease due to improvements in the body's disease-fighting system (immune system). Immunization can happen through:  Natural exposure to a disease.  Getting shots (vaccination).  Vaccination involves putting a small amount of germs (vaccines) into the body. This may be done through one or more shots. Some vaccines can be given by mouth or as a nasal spray, instead of a shot. Vaccination helps to prevent:  Serious diseases such as polio, measles, and whooping cough.  Common infections, such as the flu.  Vaccination starts at birth. Teens and adults also need vaccines regularly. Talk with your health care provider about the immunization schedule that is best for you. Some vaccines need to be repeated when you are older. How does immunization prevent disease? Immunization occurs when the body is exposed to germs that cause a certain disease. The body responds to this exposure by forming proteins (antibodies) to fight those germs. Germs in vaccines are dead or very weak, so they will not make you sick. However, the antibodies that your body makes will stay in your body  for a long time. This improves the ability of your immune system to fight the germs in the future. If you get exposed to the germs again, you may be able to resist them (develop immunity against them). This is because your antibodies may be able to destroy the germs before you get sick. Why should I prevent diseases through immunization? Vaccines can protect you from getting diseases that can cause harmful complications and even death. Getting vaccinated also helps to keep other people healthy. If you are vaccinated, you cannot spread disease to others, and that can make the disease become less common. If people keep getting vaccinated, certain diseases may become rare or go away. If people stop getting vaccinated, certain diseases could become more common. Not everyone can get a vaccine. Very young babies, people who are very sick, or older people may not be able to get vaccines. By getting immunized, you help to protect people who are not able to be vaccinated. Where to find more information: To learn more about immunization, visit:  World Health Organization: https://www.davis-walter.com/  Centers for Disease Control and Prevention: http://www.murphy.com/  Summary  Immunization occurs when the body is exposed to germs that cause a certain disease and responds by forming proteins (antibodies) to fight those germs.  Getting vaccines is a safe and effective way to develop immunity against specific germs and the diseases that they cause.  Talk with your health care provider about your immunization schedule, and stay up to date with all  of your shots. This information is not intended to replace advice given to you by your health care provider. Make sure you discuss any questions you have with your health care provider. Document Released: 10/20/2015 Document Revised: 05/06/2016 Document Reviewed: 05/06/2016 Elsevier Interactive Patient Education  [employer].

## 2017-10-08 NOTE — Progress Notes (Signed)
ANNUAL PREVENTATIVE VISIT AND CPE  Subjective:  Deanna Knight is a 67 y.o. female who presents for initial medical wellness visit.  She says that she generally feels well.  Patient is up to date on vaccinations and had a routine gynecological exam on 01/09/2017.  Patient does not have a history of high blood pressure. Reviewed previous blood pressures, which are in the pre hypertensive range. Her current blood pressure is 138/82 manually. Deanna Knight does not exercise routinely and does not follow a low fat, low sodium diet. She denies chest pain, shortness of breath, dizziness.  She is not on cholesterol medication and denies myalgias.   Deanna Knight does not have a family history of type 2 diabetes mellitus.    Last A1C in the office was:      Lab Results  Component Value Date   HGBA1C 5.2 10/11/2016   Patient is on Vitamin D supplement.   No results found for: VD25OH     Names of Other Physician/Practitioners you currently use: Patient Care Team: Deanna Dew, FNP as PCP - General (Family Medicine)   Medication Review: Current Outpatient Medications on File Prior to Visit  Medication Sig Dispense Refill  . albuterol (PROVENTIL HFA;VENTOLIN HFA) 108 (90 BASE) MCG/ACT inhaler Inhale 1-2 puffs into the lungs every 6 (six) hours as needed for wheezing or shortness of breath. Dispense with aerochamber 1 Inhaler 0  . amoxicillin (AMOXIL) 875 MG tablet Take 1 tablet (875 mg total) by mouth 2 (two) times daily. 20 tablet 0  . HYDROcodone-homatropine (HYDROMET) 5-1.5 MG/5ML syrup Take 5 mLs by mouth every 6 (six) hours as needed for cough. 60 mL 0  . predniSONE (DELTASONE) 20 MG tablet Two daily with food 10 tablet 0   No current facility-administered medications on file prior to visit.     Current Problems (verified) Patient Active Problem List   Diagnosis Date Noted  . Pap test, as part of routine gynecological examination 01/09/2017  . Hypercholesterolemia 10/11/2016    . Hyperglycemia 10/11/2016  . Blurred vision, left eye 10/11/2016    Screening Tests Health Maintenance  Topic Date Due  . MAMMOGRAM  02/21/2001  . COLONOSCOPY  02/21/2001  . DEXA SCAN  02/22/2016  . PNA vac Low Risk Adult (2 of 2 - PPSV23) 10/11/2017  . TETANUS/TDAP  03/11/2024  . Hepatitis C Screening  Addressed    Immunization History  Administered Date(s) Administered  . Influenza,inj,Quad PF,6+ Mos 10/11/2016  . Pneumococcal Conjugate-13 10/11/2016    Immunization History  Administered Date(s) Administered  . Influenza,inj,Quad PF,6+ Mos 10/11/2016  . Pneumococcal Conjugate-13 10/11/2016   This medication is not covered by your insurance if dispensed from your Physician's office. You can obtain your vaccine at an area Pharmacy.  Allergies as of 10/08/2017      Reactions   Sulfa Antibiotics    Sulfur Itching, Swelling      Medication List        Accurate as of 10/08/17  2:58 PM. Always use your most recent med list.          albuterol 108 (90 Base) MCG/ACT inhaler Commonly known as:  PROVENTIL HFA;VENTOLIN HFA Inhale 1-2 puffs into the lungs every 6 (six) hours as needed for wheezing or shortness of breath. Dispense with aerochamber   amoxicillin 875 MG tablet Commonly known as:  AMOXIL Take 1 tablet (875 mg total) by mouth 2 (two) times daily.   HYDROcodone-homatropine 5-1.5 MG/5ML syrup Commonly known as:  HYDROMET Take 5 mLs  by mouth every 6 (six) hours as needed for cough.   predniSONE 20 MG tablet Commonly known as:  DELTASONE Two daily with food       Past Surgical History:  Procedure Laterality Date  . LUNG SURGERY     Family History  Problem Relation Age of Onset  . Stroke Mother   . Heart disease Mother   . Heart disease Father   . Stroke Sister   . Heart disease Sister   . Stroke Brother   . Heart disease Brother     History reviewed: allergies, current medications, past family history, past medical history, past social  history, past surgical history and problem list   Risk Factors: Osteoporosis/FallRisk: Patient does not have a history of fractures.  She also denies a history of estrogen deficiency  Tobacco Social History   Tobacco Use  . Smoking status: Former Smoker    Years: 1.00  . Smokeless tobacco: Former Network engineer Use Topics  . Alcohol use: Yes  . Drug use: No   She does not smoke.  Patient is a former smoker. Are there smokers in your home (other than you)?  Yes  Alcohol Current alcohol use: none  Caffeine Current caffeine use: denies use  Exercise Patient works at a daycare and remains physically active  Nutrition/Diet Current diet: well balanced  Cardiac risk factors: advanced age (older than 26 for men, 52 for women).  Depression Screen Depression screen [facility] 2/9 10/08/2017 01/09/2017 10/11/2016  Decreased Interest 0 0 0  Down, Depressed, Hopeless 0 0 0  PHQ - 2 Score 0 0 0      Activities of Daily Living In your present state of health, do you have any difficulty performing the following activities?:  Driving? No Managing money?  No Feeding yourself? No Getting from bed to chair? No Climbing a flight of stairs? No Preparing food and eating? No Bathing or showering? No Getting dressed? No Getting to the toilet? No In the past year have you fallen or had a near fall?:No    Are you sexually active?  No    Vision Difficulties: Last vision exam 1 year ago  Hearing Difficulties:  Do you often ask people to speak up or repeat themselves?No Do you experience ringing or noises in your ears? No Do you have difficulty understanding soft or whispered voices? No Cognition     Do you feel safe at home?  Yes  Advanced directives Does patient have a [place]? No Does patient have a Living Will? No   Objective:     Blood pressure 138/82, pulse 71, temperature 98.1 F (36.7 C), temperature source Oral, resp. rate 16, height 5\' 7"  (1.702  m), weight 138 lb (62.6 kg), SpO2 99 %. Body mass index is 21.61 kg/m.  General appearance: alert, no distress, WD/WN, female Cognitive Testing  No flowsheet data found. HEENT: normocephalic, sclerae anicteric, TMs pearly, nares patent, no discharge or erythema, pharynx normal Oral cavity: MMM, no lesions Neck: supple, no lymphadenopathy, no thyromegaly, no masses Heart: RRR, normal S1, S2, no murmurs Lungs: CTA bilaterally, no wheezes, rhonchi, or rales Abdomen: +bs, soft, non tender, non distended, no masses, no hepatomegaly, no splenomegaly Musculoskeletal: nontender, no swelling, no obvious deformity Extremities: no edema, no cyanosis, no clubbing Pulses: 2+ symmetric, upper and lower extremities, normal cap refill Neurological: alert, oriented x 3, CN2-12 intact, strength normal upper extremities and lower extremities, sensation normal throughout, DTRs 2+ throughout, no cerebellar signs, gait  normal Psychiatric: normal affect, behavior normal, pleasant   Assessment:  Patient denies any difficulties at home. No trouble with ADLs, depression or falls. No recent changes to vision or hearing. Is UTD with immunizations. Is UTD with screening. Discussed Advanced Directives, patient agrees to bring [place] copies of documents if can. Encouraged heart healthy diet, exercise as tolerated and adequate sleep.     Plan:   During the course of the visit the patient was educated and counseled about appropriate screening and preventive services including:    Pneumococcal vaccine   Influenza vaccine  Td vaccine  Screening electrocardiogram  Bone densitometry screening  Colorectal cancer screening  Diabetes screening  Glaucoma screening  Nutrition counseling   Advanced directives: requested  Screening recommendations, referrals: Vaccinations: Please see documentation below and orders this visit.  Nutrition assessed and recommended  Colonoscopy requested Recommended yearly  ophthalmology/optometry visit for glaucoma screening and checkup Recommended yearly dental visit for hygiene and checkup Advanced directives - requested   Medicare Attestation I have personally reviewed: The patient's medical and social history Their use of alcohol, tobacco or illicit drugs Their current medications and supplements The patient's functional ability including ADLs,fall risks, home safety risks, cognitive, and hearing and visual impairment Diet and physical activities Evidence for depression or mood disorders  The patient's weight, height, BMI, and visual acuity have been recorded in the chart.  I have made referrals, counseling, and provided education to the patient based on review of the above and I have provided the patient with a written personalized care plan for preventive services.     [name]  MSN, FNP-C [facility] [address] [place], [place] [address] [phone]

## 2017-10-19 ENCOUNTER — Encounter: Payer: Self-pay | Admitting: Family Medicine

## 2017-11-12 ENCOUNTER — Encounter: Payer: Self-pay | Admitting: Family Medicine

## 2017-11-12 ENCOUNTER — Ambulatory Visit (INDEPENDENT_AMBULATORY_CARE_PROVIDER_SITE_OTHER): Payer: Medicare Other | Admitting: Family Medicine

## 2017-11-12 VITALS — BP 127/65 | HR 70 | Temp 98.2°F | Resp 14 | Ht 67.0 in | Wt 138.0 lb

## 2017-11-12 DIAGNOSIS — K1379 Other lesions of oral mucosa: Secondary | ICD-10-CM

## 2017-11-12 DIAGNOSIS — Z13220 Encounter for screening for lipoid disorders: Secondary | ICD-10-CM | POA: Diagnosis not present

## 2017-11-12 DIAGNOSIS — R220 Localized swelling, mass and lump, head: Secondary | ICD-10-CM

## 2017-11-12 MED ORDER — CHLORHEXIDINE GLUCONATE 0.12% ORAL RINSE (MEDLINE KIT): 5.0000 mL | Freq: Two times a day (BID) | OROMUCOSAL | 0 refills | Status: AC

## 2017-11-12 MED ORDER — DIPHENHYDRAMINE HCL 12.5 MG/5ML PO SYRP
6.2500 mg | ORAL_SOLUTION | Freq: Four times a day (QID) | ORAL | 0 refills | Status: AC | PRN
Start: 2017-11-12 — End: ?

## 2017-11-12 NOTE — Patient Instructions (Addendum)
For mouth sores, rinse mouth with chlorhexidine and gluconate twice daily for 7 days.  Follow-up with dentist as scheduled. For swelling to right lip, recommend diphenhydramine 6.25 mg every 6 hours as needed.  We will follow-up by phone with any abnormal laboratory results. Oral Ulcers Oral ulcers are sores inside the mouth or near the mouth. They may be called canker sores or cold sores, which are two types of oral ulcers. Many oral ulcers are harmless and go away on their own. In some cases, oral ulcers may require medical care to determine the cause and proper treatment. What are the causes? Common causes of this condition include:  Viral, bacterial, or fungal infection.  Emotional stress.  Foods or chemicals that irritate the mouth.  Injury or physical irritation of the mouth.  Medicines.  Allergies.  Tobacco use.  Less common causes include:  Skin disease.  A type of herpes virus infection (herpes simplexor herpes zoster).  Oral cancer.  In some cases, the cause of this condition may not be known. What increases the risk? Oral ulcers are more likely to develop in:  People who wear dental braces, dentures, or retainers.  People who do not keep their mouth clean or brush their teeth regularly.  People who have sensitive skin.  People who have conditions that affect the entire body (systemic conditions), such as immune disorders.  What are the signs or symptoms? The main symptom of this condition is one or more oval-shaped or round ulcers that have red borders. Details about symptoms may vary depending on the cause.  Location of the ulcers. They may be inside the mouth, on the gums, or on the insides of the lips or cheeks. They may also be on the lips or on skin that is near the mouth, such as the cheeks and chin.  Pain. Ulcers can be painful and uncomfortable, or they can be painless.  Appearance of the ulcers. They may look like red blisters and be filled with  fluid, or they may be white or yellow patches.  Frequency of outbreaks. Ulcers may go away permanently after one outbreak, or they may come back (recur) often or rarely.  How is this diagnosed? This condition is diagnosed with a physical exam. Your health care provider may ask you questions about your lifestyle and your medical history. You may have tests, including:  Blood tests.  Removal of a small number of cells from an ulcer to be examined under a microscope (biopsy).  How is this treated? This condition is treated by managing any pain and discomfort, and by treating the underlying cause of the ulcers, if necessary. Usually, oral ulcers resolve by themselves in 1-2 weeks. You may be told to keep your mouth clean and avoid things that cause or irritate your ulcers. Your health care provider may prescribe medicines to reduce pain and discomfort or treat the underlying cause, if this applies. Follow these instructions at home: Lifestyle  Follow instructions from your health care provider about eating or drinking restrictions. ? Drink enough fluid to keep your urine clear or pale yellow. ? Avoid foods and drinks that irritate your ulcers.  Avoid tobacco products, including cigarettes, chewing tobacco, or e-cigarettes. If you need help quitting, ask your health care provider.  Avoid excessive alcohol use. Oral Hygiene  Avoid physical or chemical irritants that may have caused the ulcers or made them worse, such as mouthwashes that contain alcohol (ethanol). If you wear dental braces, dentures, or retainers, work with your  health care provider to make sure these devices are fitted correctly.  Brush and floss your teeth at least once every day, and get regular dental cleanings and checkups.  Gargle with a salt-water mixture 3-4 times per day or as told by your health care provider. To make a salt-water mixture, completely dissolve -1 tsp of salt in 1 cup of warm water. General  instructions  Take over-the-counter and prescription medicines only as told by your health care provider.  If you have pain, wrap a cold compress in a towel and gently press it against your face to help reduce pain.  Keep all follow-up visits as told by your health care provider. This is important. Contact a health care provider if:  You have pain that gets worse or does not get better with medicine.  You have 4 or more ulcers at one time.  You have a fever.  You have new ulcers that look or feel different from other ulcers you have.  You have inflammation in one eye or both eyes.  You have ulcers that do not go away after 10 days.  You develop new symptoms in your mouth, such as: ? Bleeding or crusting around your lips or gums. ? Tooth pain. ? Difficulty swallowing.  You develop symptoms on your skin or genitals, such as: ? A rash or blisters. ? Burning or itching sensations.  Your ulcers begin or get worse after you start a new medicine. Get help right away if:  You have difficulty breathing.  You have swelling in your face or neck.  You have excessive bleeding from your mouth.  You have severe pain. This information is not intended to replace advice given to you by your health care provider. Make sure you discuss any questions you have with your health care provider. Document Released: 10/05/2004 Document Revised: 01/31/2016 Document Reviewed: 01/13/2015 Elsevier Interactive Patient Education  [employer].

## 2017-11-12 NOTE — Progress Notes (Signed)
Deanna Knight, a 67 year old female presents complaining of upper lip swelling and mouth sore for greater than 2 weeks.  Patient states that she awakened with lip swelling and characterizes swelling as itching and tingling.  Patient also endorses a mild sore under right tongue.  She has a history of periodontal disease and dental caries.  She denies facial swelling, fever, fatigue, headache, dizziness, paresthesias, dysuria, nausea, vomiting, or diarrhea.  Past Medical History:  Diagnosis Date  . Hypercholesteremia   . Pneumothorax   . Sarcoidosis    Social History   Socioeconomic History  . Marital status: Divorced    Spouse name: Not on file  . Number of children: Not on file  . Years of education: Not on file  . Highest education level: Not on file  Social Needs  . Financial resource strain: Not on file  . Food insecurity - worry: Not on file  . Food insecurity - inability: Not on file  . Transportation needs - medical: Not on file  . Transportation needs - non-medical: Not on file  Occupational History  . Not on file  Tobacco Use  . Smoking status: Former Smoker    Years: 1.00  . Smokeless tobacco: Former Network engineer and Sexual Activity  . Alcohol use: Yes  . Drug use: No  . Sexual activity: No  Other Topics Concern  . Not on file  Social History Narrative  . Not on file   Immunization History  Administered Date(s) Administered  . Influenza,inj,Quad PF,6+ Mos 10/11/2016  . Pneumococcal Conjugate-13 10/11/2016   Review of Systems  Constitutional: Negative.   HENT: Negative.   Eyes: Negative.   Respiratory: Negative.   Cardiovascular: Negative.   Gastrointestinal: Negative.   Genitourinary: Negative.   Musculoskeletal: Negative.   Skin: Negative.        Mouth ulcer  Lip swelling  Neurological: Negative.   Endo/Heme/Allergies: Negative.   Psychiatric/Behavioral: Negative.   Physical Exam  Skin:     Plan  1. 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  Dispense: 120 mL; Refill: 0  2. Lip swelling - diphenhydrAMINE (BENYLIN) 12.5 MG/5ML syrup; Take 2.5 mLs (6.25 mg total) by mouth 4 (four) times daily as needed for allergies.  Dispense: 120 mL; Refill: 0  3. Screening for cholesterol level  - Lipid Panel   RTC: Will follow up by phone with any abnormal laboratory results   [name]  MSN, FNP-C [facility] [address] [place], [place] [address] [phone]

## 2017-11-13 LAB — LIPID PANEL
CHOL/HDL RATIO: 2.6 ratio (ref 0.0–4.4)
CHOLESTEROL TOTAL: 251 mg/dL — AB (ref 100–199)
HDL: 97 mg/dL (ref >39)
LDL CALC: 132 mg/dL — AB (ref 0–99)
TRIGLYCERIDES: 109 mg/dL (ref 0–149)
VLDL Cholesterol Cal: 22 mg/dL (ref 5–40)

## 2017-11-13 LAB — HSV(HERPES SIMPLEX VRS) I + II AB-IGG
HSV 1 GLYCOPROTEIN G AB, IGG: 31.9 {index} — AB (ref 0.00–0.90)
HSV 2 IgG, Type Spec: 4.14 index — ABNORMAL HIGH (ref 0.00–0.90)

## 2017-11-13 LAB — HSV-2 IGG SUPPLEMENTAL TEST: HSV-2 IgG Supplemental Test: POSITIVE — AB

## 2017-11-14 ENCOUNTER — Telehealth: Payer: Self-pay | Admitting: Family Medicine

## 2017-11-14 NOTE — Telephone Encounter (Signed)
Attempted to call patient to discuss laboratory results from office visit on 11/12/2017.   Voicemail is not set up, will attempt to reach patient at later time.   [name]  MSN, FNP-C [facility] [address] [place], [place] [address] [phone]

## 2018-04-08 ENCOUNTER — Ambulatory Visit: Payer: Medicare Other | Admitting: Family Medicine

## 2018-12-05 ENCOUNTER — Ambulatory Visit: Payer: Medicare Other | Admitting: Family Medicine

## 2018-12-26 ENCOUNTER — Ambulatory Visit: Payer: Medicare Other | Admitting: Family Medicine

## 2019-01-23 ENCOUNTER — Encounter: Payer: Self-pay | Admitting: Family Medicine

## 2019-01-23 ENCOUNTER — Ambulatory Visit (INDEPENDENT_AMBULATORY_CARE_PROVIDER_SITE_OTHER): Payer: Medicare Other | Admitting: Family Medicine

## 2019-01-23 ENCOUNTER — Other Ambulatory Visit: Payer: Self-pay

## 2019-01-23 VITALS — BP 138/82 | HR 69 | Temp 99.3°F | Resp 14 | Ht 67.0 in | Wt 139.0 lb

## 2019-01-23 DIAGNOSIS — Z23 Encounter for immunization: Secondary | ICD-10-CM | POA: Diagnosis not present

## 2019-01-23 DIAGNOSIS — Z780 Asymptomatic menopausal state: Secondary | ICD-10-CM

## 2019-01-23 DIAGNOSIS — Z0000 Encounter for general adult medical examination without abnormal findings: Secondary | ICD-10-CM | POA: Diagnosis not present

## 2019-01-23 DIAGNOSIS — Z1211 Encounter for screening for malignant neoplasm of colon: Secondary | ICD-10-CM

## 2019-01-23 DIAGNOSIS — Z1231 Encounter for screening mammogram for malignant neoplasm of breast: Secondary | ICD-10-CM

## 2019-01-23 DIAGNOSIS — E559 Vitamin D deficiency, unspecified: Secondary | ICD-10-CM

## 2019-01-23 DIAGNOSIS — Z13820 Encounter for screening for osteoporosis: Secondary | ICD-10-CM

## 2019-01-23 DIAGNOSIS — R030 Elevated blood-pressure reading, without diagnosis of hypertension: Secondary | ICD-10-CM

## 2019-01-23 NOTE — Patient Instructions (Signed)

## 2019-01-23 NOTE — Progress Notes (Signed)
.ad  [facility]   Progress Note: General Provider: Lanae Boast, FNP  SUBJECTIVE:   Deanna Knight is a 69 y.o. female who  has a past medical history of Hypercholesteremia, Pneumothorax, and Sarcoidosis.. Patient presents today for Annual Exam She denies problems or concerns at the present time. She needs a colonoscopy, Dexa scan and mammogram. She states that she is doing well. BP elevated due to white coat syndrome.    Review of Systems  Constitutional: Negative.   HENT: Negative.   Eyes: Negative.   Respiratory: Negative.   Cardiovascular: Negative.   Gastrointestinal: Negative.   Genitourinary: Negative.   Musculoskeletal: Negative.   Skin: Negative.   Neurological: Negative.   Psychiatric/Behavioral: Negative.      OBJECTIVE: BP 138/82 Comment: manually  Pulse 69   Temp 99.3 F (37.4 C) (Oral)   Resp 14   Ht 5\' 7"  (1.702 m)   Wt 139 lb (63 kg)   SpO2 100%   BMI 21.77 kg/m   Wt Readings from Last 3 Encounters:  01/23/19 139 lb (63 kg)  11/12/17 138 lb (62.6 kg)  10/08/17 138 lb (62.6 kg)     Physical Exam Vitals signs and nursing note reviewed.  Constitutional:      General: She is not in acute distress.    Appearance: She is well-developed.  HENT:     Head: Normocephalic and atraumatic.  Eyes:     Conjunctiva/sclera: Conjunctivae normal.     Pupils: Pupils are equal, round, and reactive to light.  Neck:     Musculoskeletal: Normal range of motion and neck supple.     Vascular: No JVD.  Cardiovascular:     Rate and Rhythm: Normal rate and regular rhythm.     Heart sounds: Normal heart sounds. No murmur. No friction rub. No gallop.   Pulmonary:     Effort: Pulmonary effort is normal. No respiratory distress.     Breath sounds: Normal breath sounds. No wheezing or rales.  Chest:     Chest wall: No tenderness.  Abdominal:     General: Bowel sounds are normal. There is no distension.   Palpations: Abdomen is soft. There is no mass.     Tenderness: There is no abdominal tenderness.  Musculoskeletal: Normal range of motion.  Lymphadenopathy:     Cervical: No cervical adenopathy.  Skin:    General: Skin is warm and dry.  Neurological:     Mental Status: She is alert and oriented to person, place, and time.  Psychiatric:        Behavior: Behavior normal.        Thought Content: Thought content normal.        Judgment: Judgment normal.     ASSESSMENT/PLAN:   1. Annual physical exam - MM Digital Screening; Future - DG Bone Density; Future - CBC With Differential; Future - Comprehensive metabolic panel; Future - Lipid Panel With LDL/HDL Ratio; Future - TSH; Future - VITAMIN D 25 Hydroxy (Vit-D Deficiency, Fractures); Future  2. Screening for colon cancer - HM COLONOSCOPY - Ambulatory referral to Gastroenterology  3. Screening for osteoporosis - DG Bone Density; Future  4. Encounter for screening mammogram for malignant neoplasm of breast - MM Digital Screening; Future  5. Post-menopausal - DG Bone Density; Future  6. Vitamin D deficiency - VITAMIN D 25 Hydroxy (Vit-D Deficiency, Fractures); Future  7. Elevated BP without diagnosis of hypertension BP decreased as the encounter concluded.  - CBC  With Differential; Future    Return in about 1 year (around 01/23/2020), or if symptoms worsen or fail to improve, for annual exam.    The patient was given clear instructions to go to ER or return to medical center if symptoms do not improve, worsen or new problems develop. The patient verbalized understanding and agreed with plan of care.   [name], FNP-BC [facility] [address] [place], [place] [address] [phone]

## 2019-02-05 ENCOUNTER — Other Ambulatory Visit: Payer: Medicare Other

## 2019-02-24 ENCOUNTER — Encounter: Payer: Self-pay | Admitting: Gastroenterology

## 2019-03-19 ENCOUNTER — Other Ambulatory Visit: Payer: Self-pay

## 2019-03-19 ENCOUNTER — Ambulatory Visit (AMBULATORY_SURGERY_CENTER): Payer: Self-pay | Admitting: *Deleted

## 2019-03-19 VITALS — Ht 67.0 in | Wt 137.0 lb

## 2019-03-19 DIAGNOSIS — Z1211 Encounter for screening for malignant neoplasm of colon: Secondary | ICD-10-CM

## 2019-03-19 NOTE — Progress Notes (Signed)

## 2019-04-01 ENCOUNTER — Telehealth: Payer: Self-pay | Admitting: Gastroenterology

## 2019-04-01 NOTE — Telephone Encounter (Signed)

## 2019-04-02 ENCOUNTER — Other Ambulatory Visit: Payer: Self-pay

## 2019-04-02 ENCOUNTER — Ambulatory Visit (AMBULATORY_SURGERY_CENTER): Payer: Medicare Other | Admitting: Gastroenterology

## 2019-04-02 ENCOUNTER — Encounter: Payer: Self-pay | Admitting: Gastroenterology

## 2019-04-02 VITALS — BP 152/77 | HR 65 | Temp 98.8°F | Resp 16 | Ht 67.0 in | Wt 137.0 lb

## 2019-04-02 DIAGNOSIS — D125 Benign neoplasm of sigmoid colon: Secondary | ICD-10-CM | POA: Diagnosis not present

## 2019-04-02 DIAGNOSIS — D124 Benign neoplasm of descending colon: Secondary | ICD-10-CM | POA: Diagnosis not present

## 2019-04-02 DIAGNOSIS — Z1211 Encounter for screening for malignant neoplasm of colon: Secondary | ICD-10-CM | POA: Diagnosis not present

## 2019-04-02 DIAGNOSIS — D122 Benign neoplasm of ascending colon: Secondary | ICD-10-CM | POA: Diagnosis not present

## 2019-04-02 MED ORDER — SODIUM CHLORIDE 0.9 % IV SOLN: 500.0000 mL | Freq: Once | INTRAVENOUS | Status: DC

## 2019-04-02 NOTE — Patient Instructions (Signed)
YOU HAD AN ENDOSCOPIC PROCEDURE TODAY AT [facility]:   Refer to the procedure report that was given to you for any specific questions about what was found during the examination.  If the procedure report does not answer your questions, please call your gastroenterologist to clarify.  If you requested that your care partner not be given the details of your procedure findings, then the procedure report has been included in a sealed envelope for you to review at your convenience later.  YOU SHOULD EXPECT: Some feelings of bloating in the abdomen. Passage of more gas than usual.  Walking can help get rid of the air that was put into your GI tract during the procedure and reduce the bloating. If you had a lower endoscopy (such as a colonoscopy or flexible sigmoidoscopy) you may notice spotting of blood in your stool or on the toilet paper. If you underwent a bowel prep for your procedure, you may not have a normal bowel movement for a few days.  Please Note:  You might notice some irritation and congestion in your nose or some drainage.  This is from the oxygen used during your procedure.  There is no need for concern and it should clear up in a day or so.  SYMPTOMS TO REPORT IMMEDIATELY:   Following lower endoscopy (colonoscopy or flexible sigmoidoscopy):  Excessive amounts of blood in the stool  Significant tenderness or worsening of abdominal pains  Swelling of the abdomen that is new, acute  Fever of 100F or higher  Please see handout given to you on Polyps, Diverticulosis and Hemorrhoids.  For urgent or emergent issues, a gastroenterologist can be reached at any hour by calling [phone].   DIET:  We do recommend a small meal at first, but then you may proceed to your regular diet.  Drink plenty of fluids but you should avoid alcoholic beverages for 24 hours.  ACTIVITY:  You should plan to take it easy for the rest of today and you should NOT DRIVE or use heavy machinery  until tomorrow (because of the sedation medicines used during the test).    FOLLOW UP: Our staff will call the number listed on your records 48-72 hours following your procedure to check on you and address any questions or concerns that you may have regarding the information given to you following your procedure. If we do not reach you, we will leave a message.  We will attempt to reach you two times.  During this call, we will ask if you have developed any symptoms of COVID 19. If you develop any symptoms (ie: fever, flu-like symptoms, shortness of breath, cough etc.) before then, please call [phone].  If you test positive for Covid 19 in the 2 weeks post procedure, please call and report this information to [place].    If any biopsies were taken you will be contacted by phone or by letter within the next 1-3 weeks.  Please call us at [phone] if you have not heard about the biopsies in 3 weeks.    SIGNATURES/CONFIDENTIALITY: You and/or your care partner have signed paperwork which will be entered into your electronic medical record.  These signatures attest to the fact that that the information above on your After Visit Summary has been reviewed and is understood.  Full responsibility of the confidentiality of this discharge information lies with you and/or your care-partner.  .Thank you for letting us take care of your healthcare needs today,

## 2019-04-02 NOTE — Progress Notes (Signed)
Pt's states no medical or surgical changes since previsit or office visit. 

## 2019-04-02 NOTE — Op Note (Signed)
[facility] Patient Name: Deanna Knight Procedure Date: 04/02/2019 10:32 AM MRN: [id number] Endoscopist: Thornton Park MD, MD Age: 68 Referring MD:  Date of Birth: 09-12-1950 Gender: Female Account #: [id number] Procedure:                Colonoscopy Indications:              Screening for colorectal malignant neoplasm, This                            is the patient's first colonoscopy                           No known family history of colon cancer or polyps. Medicines:                See the Anesthesia note for documentation of the                            administered medications Procedure:                Pre-Anesthesia Assessment:                           - Prior to the procedure, a History and Physical                            was performed, and patient medications and                            allergies were reviewed. The patient's tolerance of                            previous anesthesia was also reviewed. The risks                            and benefits of the procedure and the sedation                            options and risks were discussed with the patient.                            All questions were answered, and informed consent                            was obtained. Prior Anticoagulants: The patient has                            taken no previous anticoagulant or antiplatelet                            agents. ASA Grade Assessment: II - A patient with                            mild systemic disease. After reviewing the risks  and benefits, the patient was deemed in                            satisfactory condition to undergo the procedure.                           After obtaining informed consent, the colonoscope                            was passed under direct vision. Throughout the                            procedure, the patient's blood pressure, pulse, and                            oxygen saturations  were monitored continuously. The                            Colonoscope was introduced through the anus and                            advanced to the the terminal ileum, with                            identification of the appendiceal orifice and IC                            valve. A second forward view of the right colon was                            performed. The colonoscopy was performed without                            difficulty. The patient tolerated the procedure                            well. The quality of the bowel preparation was                            good. The terminal ileum, ileocecal valve,                            appendiceal orifice, and rectum were photographed. Scope In: 10:43:34 AM Scope Out: 11:03:30 AM Scope Withdrawal Time: 0 hours 16 minutes 33 seconds  Total Procedure Duration: 0 hours 19 minutes 56 seconds  Findings:                 Hemorrhoids were found on perianal exam.                           A 1 mm polyp was found in the ascending colon. The                            polyp was sessile. The polyp  was removed with a                            cold biopsy forceps. Resection and retrieval were                            complete.                           Five sessile polyps were found in the sigmoid colon                            and descending colon. The polyps were 2 to 4 mm in                            size. These polyps were removed with a cold snare.                            Resection and retrieval were complete. Estimated                            blood loss was minimal.                           A 13 mm polyp was found in the sigmoid colon. The                            polyp was pedunculated. The polyp was removed with                            a hot snare. Resection and retrieval were complete.                            Estimated blood loss: none.                           A few small-mouthed diverticula were found in the                             sigmoid colon, descending colon, ascending colon                            and cecum.                           Non-bleeding internal hemorrhoids were found. The                            hemorrhoids were small.                           The exam was otherwise without abnormality on                            direct and retroflexion views.  Complications:            No immediate complications. Estimated blood loss:                            Minimal. Estimated Blood Loss:     Estimated blood loss was minimal. Impression:               - Hemorrhoids found on perianal exam.                           - One 1 mm polyp in the ascending colon, removed                            with a cold biopsy forceps. Resected and retrieved.                           - Five 2 to 4 mm polyps in the sigmoid colon and in                            the descending colon, removed with a cold snare.                            Resected and retrieved.                           - One 13 mm polyp in the sigmoid colon, removed                            with a hot snare. Resected and retrieved.                           - Diverticulosis in the sigmoid colon, in the                            descending colon, in the ascending colon and in the                            cecum.                           - Non-bleeding internal hemorrhoids.                           - The examination was otherwise normal on direct                            and retroflexion views. Recommendation:           - Patient has a contact number available for                            emergencies. The signs and symptoms of potential                            delayed complications were discussed  with the                            patient. Return to normal activities tomorrow.                            Written discharge instructions were provided to the                            patient.                           -  Resume regular diet today. High fiber diet                            recommended.                           - Continue present medications.                           - Await pathology results.                           - Repeat colonoscopy date to be determined after                            pending pathology results are reviewed for                            surveillance. Thornton Park MD, MD 04/02/2019 11:14:17 AM This report has been signed electronically.

## 2019-04-02 NOTE — Progress Notes (Signed)
To PACU, VSS. Report to [email or web address] 

## 2019-04-04 ENCOUNTER — Telehealth: Payer: Self-pay | Admitting: *Deleted

## 2019-04-04 NOTE — Telephone Encounter (Signed)
Second attempt, placed on hold with no answer.

## 2019-04-04 NOTE — Telephone Encounter (Signed)
First attempt, Called and placed on hold and no one picked up. Will try again later.

## 2019-04-10 ENCOUNTER — Encounter: Payer: Self-pay | Admitting: Gastroenterology

## 2019-05-21 ENCOUNTER — Encounter (HOSPITAL_COMMUNITY): Payer: Self-pay

## 2019-05-21 ENCOUNTER — Encounter (HOSPITAL_COMMUNITY): Payer: Self-pay | Admitting: *Deleted

## 2019-11-08 ENCOUNTER — Ambulatory Visit: Payer: Medicare (Managed Care) | Attending: Internal Medicine

## 2019-11-08 DIAGNOSIS — Z23 Encounter for immunization: Secondary | ICD-10-CM | POA: Insufficient documentation

## 2019-11-08 NOTE — Progress Notes (Signed)
   Covid-19 Vaccination Clinic  Name:  Deanna Knight    MRN: [id number] DOB: [birth year]  11/08/2019  [name] was observed post Covid-19 immunization for 15 minutes without incidence. She was provided with Vaccine Information Sheet and instruction to access the V-Safe system.   [name] was instructed to call 911 with any severe reactions post vaccine: Marland Kitchen Difficulty breathing  . Swelling of your face and throat  . A fast heartbeat  . A bad rash all over your body  . Dizziness and weakness    Immunizations Administered    Name Date Dose VIS Date Route   Pfizer COVID-19 Vaccine 11/08/2019 11:06 AM 0.3 mL 08/22/2019 Intramuscular   Manufacturer: [place]   Lot: [id number]   [place]: [id number]

## 2019-12-03 ENCOUNTER — Ambulatory Visit: Payer: Medicare (Managed Care) | Attending: Internal Medicine

## 2019-12-03 DIAGNOSIS — Z23 Encounter for immunization: Secondary | ICD-10-CM

## 2019-12-03 NOTE — Progress Notes (Signed)
   Covid-19 Vaccination Clinic  Name:  Deanna Knight    MRN: [id number] DOB: [birth year]  12/03/2019  [name] was observed post Covid-19 immunization for 15 minutes without incident. She was provided with Vaccine Information Sheet and instruction to access the V-Safe system.   [name] was instructed to call 911 with any severe reactions post vaccine: Marland Kitchen Difficulty breathing  . Swelling of face and throat  . A fast heartbeat  . A bad rash all over body  . Dizziness and weakness   Immunizations Administered    Name Date Dose VIS Date Route   Pfizer COVID-19 Vaccine 12/03/2019 11:34 AM 0.3 mL 08/22/2019 Intramuscular   Manufacturer: [place]   Lot: [id number]   [place]: [id number]

## 2020-03-17 ENCOUNTER — Encounter (HOSPITAL_COMMUNITY): Payer: Self-pay | Admitting: Emergency Medicine

## 2020-03-17 ENCOUNTER — Other Ambulatory Visit: Payer: Self-pay

## 2020-03-17 ENCOUNTER — Ambulatory Visit (HOSPITAL_COMMUNITY)
Admission: EM | Admit: 2020-03-17 | Discharge: 2020-03-17 | Disposition: A | Discharge status: Home / Self Care | Payer: Medicare (Managed Care) | Attending: Family Medicine | Admitting: Family Medicine

## 2020-03-17 DIAGNOSIS — S0501XA Injury of conjunctiva and corneal abrasion without foreign body, right eye, initial encounter: Secondary | ICD-10-CM

## 2020-03-17 MED ORDER — FLUORESCEIN SODIUM 1 MG OP STRP
ORAL_STRIP | OPHTHALMIC | Status: AC
  Filled 2020-03-17: qty 1

## 2020-03-17 MED ORDER — ERYTHROMYCIN 5 MG/GM OP OINT: TOPICAL_OINTMENT | OPHTHALMIC | 0 refills | Status: DC

## 2020-03-17 MED ORDER — OLOPATADINE HCL 0.2 % OP SOLN: 1.0000 [drp] | Freq: Once | OPHTHALMIC | 0 refills | Status: AC

## 2020-03-17 NOTE — ED Triage Notes (Signed)
Pt sts right eye pain with watering starting this am; pt denies obvious injury

## 2020-03-17 NOTE — ED Provider Notes (Signed)
[facility]    CSN: [id number] Arrival date & time: 03/17/20  0909      History   Chief Complaint Chief Complaint  Patient presents with  . Eye Pain    HPI Deanna Knight is a 69 y.o. female.   Pt presents with R eye pain beginning this morning. Reports blurred vision, redness, and tearing from right eye. Started after riding in her car and believes that she may have gotten something in the eye.  Denies recent trauma or known foreign body in eye, contact use. Works with children.   ROS per HPI      Past Medical History:  Diagnosis Date  . Allergy    seasonal  . Hypercholesteremia   . Pneumothorax   . Sarcoidosis     Patient Active Problem List   Diagnosis Date Noted  . Pap test, as part of routine gynecological examination 01/09/2017  . Hypercholesterolemia 10/11/2016  . Hyperglycemia 10/11/2016  . Blurred vision, left eye 10/11/2016    Past Surgical History:  Procedure Laterality Date  . LUNG SURGERY     denies surgery 03/19/19  . TUBAL LIGATION      OB History   No obstetric history on file.      Home Medications    Prior to Admission medications   Medication Sig Start Date End Date Taking? Authorizing Provider  albuterol (PROVENTIL HFA;VENTOLIN HFA) 108 (90 Base) MCG/ACT inhaler Inhale 2 puffs into the lungs every 6 (six) hours as needed for wheezing or shortness of breath. Dispense with aerochamber 10/08/17   Dorena Dew, FNP  diphenhydrAMINE (BENYLIN) 12.5 MG/5ML syrup Take 2.5 mLs (6.25 mg total) by mouth 4 (four) times daily as needed for allergies. 11/12/17   Dorena Dew, FNP  erythromycin ophthalmic ointment Place a 1/2 inch ribbon of ointment into the lower eyelid up to 6 times a day. 03/17/20   Loura Halt A, NP  Multiple Vitamins-Minerals (CENTRUM ADULTS) TABS Take by mouth daily.    [provider]  Olopatadine HCl 0.2 % SOLN Apply 1 drop to eye once for 1 dose. 03/17/20 03/17/20  Orvan July, NP    Family  History Family History  Problem Relation Age of Onset  . Stroke Mother   . Heart disease Mother   . Heart disease Father   . Stroke Sister   . Heart disease Sister   . Leukemia Sister   . Stroke Brother   . Heart disease Brother   . Lung cancer Paternal Aunt   . Colon cancer Neg Hx   . Colon polyps Neg Hx   . Esophageal cancer Neg Hx   . Rectal cancer Neg Hx   . Stomach cancer Neg Hx     Social History Social History   Tobacco Use  . Smoking status: Former Smoker    Years: 1.00  . Smokeless tobacco: Never Used  Vaping Use  . Vaping Use: Never used  Substance Use Topics  . Alcohol use: Yes    Comment: wine holidays  . Drug use: No     Allergies   Sulfa antibiotics and Sulfur   Review of Systems Review of Systems  Constitutional: Negative.   HENT: Negative.   Eyes: Positive for pain, discharge and redness.  Allergic/Immunologic: Negative.   Neurological: Negative.   Psychiatric/Behavioral: Negative.      Physical Exam Triage Vital Signs ED Triage Vitals [03/17/20 1010]  Enc Vitals Group     BP (!) 151/73  Pulse Rate 60     Resp 18     Temp 98.2 F (36.8 C)     Temp Source Oral     SpO2 100 %     Weight      Height      Head Circumference      Peak Flow      Pain Score 8     Pain Loc      Pain Edu?      Excl. in [place]?    No data found.  Updated Vital Signs BP (!) 151/73 (BP Location: Left Arm)   Pulse 60   Temp 98.2 F (36.8 C) (Oral)   Resp 18   SpO2 100%   Visual Acuity Right Eye Distance:   Left Eye Distance:   Bilateral Distance:    Right Eye Near:   Left Eye Near:    Bilateral Near:     Physical Exam Vitals and nursing note reviewed.  Constitutional:      General: She is not in acute distress.    Appearance: Normal appearance. She is not ill-appearing, toxic-appearing or diaphoretic.  HENT:     Head: Normocephalic.     Nose: Nose normal.  Eyes:     General: Lids are normal.        Right eye: Discharge present. No  foreign body.     Extraocular Movements:     Right eye: Normal extraocular motion.     Left eye: Normal extraocular motion.     Conjunctiva/sclera:     Right eye: Right conjunctiva is injected.     Left eye: Left conjunctiva is injected.     Pupils: Pupils are equal, round, and reactive to light.     Right eye: Pupil is round and reactive. Corneal abrasion present.     Left eye: Pupil is round and reactive.     Slit lamp exam:    Right eye: No foreign body.     Comments: Fluorescein exam done which revealed corneal abrasion  Pulmonary:     Effort: Pulmonary effort is normal.  Musculoskeletal:        General: Normal range of motion.     Cervical back: Normal range of motion.  Skin:    General: Skin is warm and dry.     Findings: No rash.  Neurological:     Mental Status: She is alert.  Psychiatric:        Mood and Affect: Mood normal.      UC Treatments / Results  Labs (all labs ordered are listed, but only abnormal results are displayed) Labs Reviewed - No data to display  EKG   Radiology No results found.  Procedures Procedures (including critical care time)  Medications Ordered in UC Medications - No data to display  Initial Impression / Assessment and Plan / UC Course  I have reviewed the triage vital signs and the nursing notes.  Pertinent labs & imaging results that were available during my care of the patient were reviewed by me and considered in my medical decision making (see chart for details).     Abrasion of right cornea Prescribe direct mycin ointment Also recommended allergy eyedrops. Follow up as needed for continued or worsening symptoms  Final Clinical Impressions(s) / UC Diagnoses   Final diagnoses:  Abrasion of right cornea, initial encounter     Discharge Instructions     Use the antibiotic  ointment in the right eye for corneal abrasion.  Also recommend  allergy eye drops. Do not do these at the same time.  Follow up as needed  for continued or worsening symptoms    ED Prescriptions    Medication Sig Dispense Auth. Provider   erythromycin ophthalmic ointment Place a 1/2 inch ribbon of ointment into the lower eyelid up to 6 times a day. 1 g Keanthony Poole A, NP   Olopatadine HCl 0.2 % SOLN Apply 1 drop to eye once for 1 dose. 2.5 mL Loura Halt A, NP     PDMP not reviewed this encounter.   Loura Halt A, NP 03/19/20 [phone]

## 2020-03-17 NOTE — Discharge Instructions (Addendum)
Use the antibiotic  ointment in the right eye for corneal abrasion.  Also recommend allergy eye drops. Do not do these at the same time.  Follow up as needed for continued or worsening symptoms

## 2020-08-26 ENCOUNTER — Ambulatory Visit (HOSPITAL_COMMUNITY)
Admission: EM | Admit: 2020-08-26 | Discharge: 2020-08-26 | Disposition: A | Discharge status: Home / Self Care | Payer: Medicare (Managed Care) | Attending: Family Medicine | Admitting: Family Medicine

## 2020-08-26 ENCOUNTER — Other Ambulatory Visit: Payer: Self-pay

## 2020-08-26 ENCOUNTER — Ambulatory Visit (INDEPENDENT_AMBULATORY_CARE_PROVIDER_SITE_OTHER): Payer: Medicare (Managed Care)

## 2020-08-26 ENCOUNTER — Encounter (HOSPITAL_COMMUNITY): Payer: Self-pay

## 2020-08-26 DIAGNOSIS — R1031 Right lower quadrant pain: Secondary | ICD-10-CM

## 2020-08-26 DIAGNOSIS — R319 Hematuria, unspecified: Secondary | ICD-10-CM | POA: Insufficient documentation

## 2020-08-26 DIAGNOSIS — K5900 Constipation, unspecified: Secondary | ICD-10-CM | POA: Insufficient documentation

## 2020-08-26 LAB — POCT URINALYSIS DIPSTICK, ED / UC
Bilirubin Urine: NEGATIVE
Glucose, UA: NEGATIVE mg/dL
Ketones, ur: NEGATIVE mg/dL
Leukocytes,Ua: NEGATIVE
Nitrite: NEGATIVE
Protein, ur: NEGATIVE mg/dL
Specific Gravity, Urine: >=1.03 (ref 1.005–1.030)
Urobilinogen, UA: 0.2 mg/dL (ref 0.0–1.0)
pH: 6 (ref 5.0–8.0)

## 2020-08-26 NOTE — ED Triage Notes (Signed)
Pt c/o right lower abdominal pain X 5 days. Pt states she has been having a tingling feeling in her right leg.

## 2020-08-26 NOTE — ED Provider Notes (Signed)
[facility]    CSN: [id number] Arrival date & time: 08/26/20  1728      History   Chief Complaint Chief Complaint  Patient presents with  . Abdominal Pain    HPI Deanna Knight is a 69 y.o. female.   HPI  Patient is here for right lower quadrant abdominal pain.  She has been having crampy pain.  She thinks it is "gas".  No urinary symptoms.  She feels like she has been moving her bowels normally.  She has been eating normally.  No fever or chills.  No nausea or vomiting. 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  She is generally in good health She did have a colonoscopy last year.  She has diverticular disease.  She does have multiple polyps.  Does not normally have any symptoms from her colon disorder, constipation or diarrhea Patient states that she does have a history of a small kidney stone in the past.  She does not remember what side it is on.  She has not noticed hematuria or flank pain. She states at times the pain feels like it goes from her right lower quadrant towards the groin area.  Past Medical History:  Diagnosis Date  . Allergy    seasonal  . Hypercholesteremia   . Pneumothorax   . Sarcoidosis     Patient Active Problem List   Diagnosis Date Noted  . Pap test, as part of routine gynecological examination 01/09/2017  . Hypercholesterolemia 10/11/2016  . Hyperglycemia 10/11/2016  . Blurred vision, left eye 10/11/2016    Past Surgical History:  Procedure Laterality Date  . LUNG SURGERY     denies surgery 03/19/19  . TUBAL LIGATION      OB History   No obstetric history on file.      Home Medications    Prior to Admission medications   Medication Sig Start Date End Date Taking? Authorizing Provider  albuterol (PROVENTIL HFA;VENTOLIN HFA) 108 (90 Base) MCG/ACT inhaler Inhale 2 puffs into the lungs every 6 (six) hours as needed for wheezing or shortness of breath. Dispense with aerochamber 10/08/17   Dorena Dew, FNP   diphenhydrAMINE (BENYLIN) 12.5 MG/5ML syrup Take 2.5 mLs (6.25 mg total) by mouth 4 (four) times daily as needed for allergies. 11/12/17   Dorena Dew, FNP  erythromycin ophthalmic ointment Place a 1/2 inch ribbon of ointment into the lower eyelid up to 6 times a day. 03/17/20   Loura Halt A, NP  Multiple Vitamins-Minerals (CENTRUM ADULTS) TABS Take by mouth daily.    [provider]    Family History Family History  Problem Relation Age of Onset  . Stroke Mother   . Heart disease Mother   . Heart disease Father   . Stroke Sister   . Heart disease Sister   . Leukemia Sister   . Stroke Brother   . Heart disease Brother   . Lung cancer Paternal Aunt   . Colon cancer Neg Hx   . Colon polyps Neg Hx   . Esophageal cancer Neg Hx   . Rectal cancer Neg Hx   . Stomach cancer Neg Hx     Social History Social History   Tobacco Use  . Smoking status: Former Smoker    Years: 1.00  . Smokeless tobacco: Never Used  Vaping Use  . Vaping Use: Never used  Substance Use Topics  . Alcohol use: Yes    Comment: wine holidays  . Drug use: No  Allergies   Sulfa antibiotics and Sulfur   Review of Systems Review of Systems See HPI  Physical Exam Triage Vital Signs ED Triage Vitals  Enc Vitals Group     BP 08/26/20 1859 (!) 143/79     Pulse Rate 08/26/20 1859 64     Resp 08/26/20 1859 16     Temp 08/26/20 1859 98.1 F (36.7 C)     Temp Source 08/26/20 1859 Oral     SpO2 08/26/20 1859 100 %     Weight --      Height --      Head Circumference --      Peak Flow --      Pain Score 08/26/20 1857 4     Pain Loc --      Pain Edu? --      Excl. in [place]? --    No data found.  Updated Vital Signs BP (!) 143/79 (BP Location: Right Arm)   Pulse 64   Temp 98.1 F (36.7 C) (Oral)   Resp 16   SpO2 100%      Physical Exam Constitutional:      General: She is not in acute distress.    Appearance: She is well-developed and well-nourished.  HENT:     Head:  Normocephalic and atraumatic.     Mouth/Throat:     Mouth: Oropharynx is clear and moist. Mucous membranes are moist.  Eyes:     Conjunctiva/sclera: Conjunctivae normal.     Pupils: Pupils are equal, round, and reactive to light.  Cardiovascular:     Rate and Rhythm: Normal rate and regular rhythm.     Heart sounds: Normal heart sounds.  Pulmonary:     Effort: Pulmonary effort is normal. No respiratory distress.     Breath sounds: Normal breath sounds.  Abdominal:     General: Bowel sounds are normal. There is no distension.     Palpations: Abdomen is soft. There is no hepatomegaly, splenomegaly or mass.     Tenderness: There is no abdominal tenderness.     Hernia: No hernia is present.  Musculoskeletal:        General: No edema. Normal range of motion.     Cervical back: Normal range of motion.  Skin:    General: Skin is warm and dry.  Neurological:     General: No focal deficit present.     Mental Status: She is alert.  Psychiatric:        Behavior: Behavior normal.      UC Treatments / Results  Labs (all labs ordered are listed, but only abnormal results are displayed) Labs Reviewed  POCT URINALYSIS DIPSTICK, ED / UC - Abnormal; Notable for the following components:      Result Value   Hgb urine dipstick MODERATE (*)    All other components within normal limits  URINE CULTURE    EKG   Radiology DG Abd 1 View  Result Date: 08/26/2020 CLINICAL DATA:  RIGHT lower quadrant pain EXAM: ABDOMEN - 1 VIEW COMPARISON:  None. FINDINGS: Large volume stool in the ascending and descending colon. Gas in the rectum. No pathologic calcifications. No dilated large or small bowel. IMPRESSION: 1. Moderate volume stool throughout the colon could indicate constipation. 2. No evidence of bowel obstruction. Electronically Signed   By: Suzy Bouchard M.D.   On: 08/26/2020 19:35    Procedures Procedures (including critical care time)  Medications Ordered in UC Medications - No data  to display  Initial Impression / Assessment and Plan / UC Course  I have reviewed the triage vital signs and the nursing notes.  Pertinent labs & imaging results that were available during my care of the patient were reviewed by me and considered in my medical decision making (see chart for details).     *Reviewed hematuria.  Recommend follow-up.  Reviewed constipation.  Recommend treatment.  Follow-up with primary care Final Clinical Impressions(s) / UC Diagnoses   Final diagnoses:  Right lower quadrant abdominal pain  Constipation, unspecified constipation type  Hematuria, unspecified type     Discharge Instructions     You need to drink more water Take the miralax daily You can take 2 x a day until you see results if needed, start tonight  You have blood in your urine This needs to be re checked by your personal doctor.  Call tomorrow to get an appointment within the next couple of weeks    ED Prescriptions    None     PDMP not reviewed this encounter.   Raylene Everts, MD 08/26/20 [phone]

## 2020-08-26 NOTE — Discharge Instructions (Signed)
You need to drink more water Take the miralax daily You can take 2 x a day until you see results if needed, start tonight  You have blood in your urine This needs to be re checked by your personal doctor.  Call tomorrow to get an appointment within the next couple of weeks

## 2020-08-27 LAB — URINE CULTURE
Culture: NO GROWTH
Special Requests: NORMAL

## 2020-09-11 DIAGNOSIS — E559 Vitamin D deficiency, unspecified: Secondary | ICD-10-CM

## 2020-09-11 HISTORY — DX: Vitamin D deficiency, unspecified: E55.9

## 2020-10-05 ENCOUNTER — Encounter: Payer: Self-pay | Admitting: Family Medicine

## 2020-10-05 ENCOUNTER — Other Ambulatory Visit: Payer: Self-pay

## 2020-10-05 ENCOUNTER — Ambulatory Visit (INDEPENDENT_AMBULATORY_CARE_PROVIDER_SITE_OTHER): Payer: Medicare (Managed Care) | Admitting: Family Medicine

## 2020-10-05 VITALS — BP 131/73 | HR 70 | Temp 97.9°F | Ht 66.5 in | Wt 130.2 lb

## 2020-10-05 DIAGNOSIS — Z09 Encounter for follow-up examination after completed treatment for conditions other than malignant neoplasm: Secondary | ICD-10-CM | POA: Diagnosis not present

## 2020-10-05 DIAGNOSIS — Z7689 Persons encountering health services in other specified circumstances: Secondary | ICD-10-CM | POA: Diagnosis not present

## 2020-10-05 DIAGNOSIS — Z0000 Encounter for general adult medical examination without abnormal findings: Secondary | ICD-10-CM

## 2020-10-05 DIAGNOSIS — J302 Other seasonal allergic rhinitis: Secondary | ICD-10-CM | POA: Diagnosis not present

## 2020-10-05 NOTE — Progress Notes (Signed)
Patient [facility] Follow Up-Re-Establish Care  Subjective:  Patient ID: Deanna Knight, female    DOB: 28-Dec-1950  Age: 70 y.o. MRN: [id number]  CC:  Chief Complaint  Patient presents with  . New Patient (Initial Visit)    HPI Deanna Knight is a 70 year old who presents for Hospital Follow Up and to Establish Care today.    Patient Active Problem List   Diagnosis Date Noted  . Pap test, as part of routine gynecological examination 01/09/2017  . Hypercholesterolemia 10/11/2016  . Hyperglycemia 10/11/2016  . Blurred vision, left eye 10/11/2016      Current Status: This will be Ms. Cheyney's initial office visit with me. She was previously seeing Lanae Boast, NP for her PCP needs. Since her last office visit, she has had and Urgent Care visit for Right Lower Quadrant Pain, which she continues to follow up with GI as needed. Otherwise, she is doing well with no complaints today. She denies fevers, chills, fatigue, recent infections, weight loss, and night sweats. She has not had any headaches, visual changes, dizziness, and falls. No chest pain, heart palpitations, cough and shortness of breath reported. No reports of GI problems such as nausea, vomiting, diarrhea, and constipation. She has no reports of blood in stools, dysuria and hematuria. No depression or anxiety reported today. She is taking all medications as prescribed. she denies pain today.    Past Medical History:  Diagnosis Date  . Allergy    seasonal  . Hypercholesteremia   . Pneumothorax   . Sarcoidosis     Past Surgical History:  Procedure Laterality Date  . LUNG SURGERY     denies surgery 03/19/19  . TUBAL LIGATION      Family History  Problem Relation Age of Onset  . Stroke Mother   . Heart disease Mother   . Heart disease Father   . Stroke Sister   . Heart disease Sister   . Leukemia Sister   . Stroke Brother   . Heart disease Brother   .  Lung cancer Paternal Aunt   . Colon cancer Neg Hx   . Colon polyps Neg Hx   . Esophageal cancer Neg Hx   . Rectal cancer Neg Hx   . Stomach cancer Neg Hx     Social History   Socioeconomic History  . Marital status: Divorced    Spouse name: Not on file  . Number of children: Not on file  . Years of education: Not on file  . Highest education level: Not on file  Occupational History  . Not on file  Tobacco Use  . Smoking status: Former Smoker    Years: 1.00  . Smokeless tobacco: Never Used  Vaping Use  . Vaping Use: Never used  Substance and Sexual Activity  . Alcohol use: Yes    Comment: wine holidays  . Drug use: No  . Sexual activity: Never  Other Topics Concern  . Not on file  Social History Narrative  . Not on file   Social Determinants of Health   Financial Resource Strain: Not on file  Food Insecurity: Not on file  Transportation Needs: Not on file  Physical Activity: Not on file  Stress: Not on file  Social Connections: Not on file  Intimate Partner Violence: Not on file    Outpatient Medications Prior to Visit  Medication Sig Dispense Refill  . erythromycin ophthalmic ointment Place a 1/2 inch  ribbon of ointment into the lower eyelid up to 6 times a day. 1 g 0  . Multiple Vitamins-Minerals (CENTRUM ADULTS) TABS Take by mouth daily.    . NON FORMULARY Boiron    . albuterol (PROVENTIL HFA;VENTOLIN HFA) 108 (90 Base) MCG/ACT inhaler Inhale 2 puffs into the lungs every 6 (six) hours as needed for wheezing or shortness of breath. Dispense with aerochamber (Patient not taking: Reported on 10/05/2020) 1 Inhaler 0  . diphenhydrAMINE (BENYLIN) 12.5 MG/5ML syrup Take 2.5 mLs (6.25 mg total) by mouth 4 (four) times daily as needed for allergies. (Patient not taking: Reported on 10/05/2020) 120 mL 0   No facility-administered medications prior to visit.    Allergies  Allergen Reactions  . Elemental Sulfur Itching and Swelling  . Sulfa Antibiotics    ROS Review  of Systems  Constitutional: Negative.   HENT: Negative.   Eyes: Negative.   Respiratory: Negative.   Cardiovascular: Negative.   Gastrointestinal: Negative.   Endocrine: Negative.   Genitourinary: Negative.   Musculoskeletal: Positive for arthralgias (generalized joint pain).  Skin: Negative.   Allergic/Immunologic: Negative.   Neurological: Negative.   Hematological: Negative.   Psychiatric/Behavioral: Negative.       Objective:    Physical Exam Vitals and nursing note reviewed.  Constitutional:      Appearance: Normal appearance.  HENT:     Head: Normocephalic and atraumatic.     Nose: Nose normal.     Mouth/Throat:     Mouth: Mucous membranes are dry.     Pharynx: Oropharynx is clear.  Cardiovascular:     Rate and Rhythm: Normal rate and regular rhythm.     Pulses: Normal pulses.     Heart sounds: Normal heart sounds.  Pulmonary:     Effort: Pulmonary effort is normal.     Breath sounds: Normal breath sounds.  Abdominal:     General: Bowel sounds are normal.     Palpations: Abdomen is soft.  Musculoskeletal:        General: Normal range of motion.     Cervical back: Normal range of motion and neck supple.  Skin:    General: Skin is warm and dry.  Neurological:     General: No focal deficit present.     Mental Status: She is alert and oriented to person, place, and time.  Psychiatric:        Mood and Affect: Mood normal.        Behavior: Behavior normal.        Thought Content: Thought content normal.        Judgment: Judgment normal.     BP 131/73 (BP Location: Left Arm, Patient Position: Sitting, Cuff Size: Normal)   Pulse 70   Temp 97.9 F (36.6 C) (Temporal)   Ht 5' 6.5" (1.689 m)   Wt 130 lb 3.2 oz (59.1 kg)   SpO2 99%   BMI 20.70 kg/m  Wt Readings from Last 3 Encounters:  10/05/20 130 lb 3.2 oz (59.1 kg)  04/02/19 137 lb (62.1 kg)  03/19/19 137 lb (62.1 kg)     Health Maintenance Due  Topic Date Due  . MAMMOGRAM  Never done  . DEXA  SCAN  Never done  . COVID-19 Vaccine (3 - Booster for Pfizer series) 06/04/2020    There are no preventive care reminders to display for this patient.  No results found for: TSH Lab Results  Component Value Date   WBC 5.8 10/11/2016   HGB 12.8 10/11/2016  HCT 39.2 10/11/2016   MCV 94.7 10/11/2016   PLT 207 10/11/2016   Lab Results  Component Value Date   NA 144 10/11/2016   K 4.2 10/11/2016   CO2 24 10/11/2016   GLUCOSE 79 10/11/2016   BUN 14 10/11/2016   CREATININE 0.77 10/11/2016   BILITOT 0.3 10/11/2016   ALKPHOS 68 10/11/2016   AST 19 10/11/2016   ALT 17 10/11/2016   PROT 6.6 10/11/2016   ALBUMIN 3.8 10/11/2016   CALCIUM 9.2 10/11/2016   ANIONGAP 8 09/19/2016   Lab Results  Component Value Date   CHOL 251 (H) 11/12/2017   Lab Results  Component Value Date   HDL 97 11/12/2017   Lab Results  Component Value Date   LDLCALC 132 (H) 11/12/2017   Lab Results  Component Value Date   TRIG 109 11/12/2017   Lab Results  Component Value Date   CHOLHDL 2.6 11/12/2017   Lab Results  Component Value Date   HGBA1C 5.2 10/11/2016   Assessment & Plan:   1. Encounter to establish care  2. Hospital discharge follow-up  3. Seasonal allergies  4. Healthcare maintenance - CBC with Differential - Comprehensive metabolic panel - Lipid Panel - TSH - Vitamin B12 - Vitamin D, 25-hydroxy  5. Follow up She will follow up in 3 months.   No orders of the defined types were placed in this encounter.   Orders Placed This Encounter  Procedures  . CBC with Differential  . Comprehensive metabolic panel  . Lipid Panel  . TSH  . Vitamin B12  . Vitamin D, 25-hydroxy    Referral Orders  No referral(s) requested today    Kathe Becton, MSN, ANE, FNP-BC [facility] [place] [address] [place], [place] [address] [phone]- fax    Problem List Items Addressed  This Visit   None   Visit Diagnoses    Encounter to establish care    [facility] discharge follow-up       Seasonal allergies       Healthcare maintenance       Relevant Orders   CBC with Differential   Comprehensive metabolic panel   Lipid Panel   TSH   Vitamin B12   Vitamin D, 25-hydroxy   Follow up          No orders of the defined types were placed in this encounter.   Follow-up: No follow-ups on file.    Azzie Glatter, FNP

## 2020-10-06 ENCOUNTER — Other Ambulatory Visit: Payer: Self-pay | Admitting: Family Medicine

## 2020-10-06 ENCOUNTER — Encounter: Payer: Self-pay | Admitting: Family Medicine

## 2020-10-06 DIAGNOSIS — Z1231 Encounter for screening mammogram for malignant neoplasm of breast: Secondary | ICD-10-CM

## 2020-10-06 DIAGNOSIS — E559 Vitamin D deficiency, unspecified: Secondary | ICD-10-CM

## 2020-10-06 LAB — COMPREHENSIVE METABOLIC PANEL
ALT: 18 IU/L (ref 0–32)
AST: 22 IU/L (ref 0–40)
Albumin/Globulin Ratio: 1.9 (ref 1.2–2.2)
Albumin: 4.8 g/dL (ref 3.8–4.8)
Alkaline Phosphatase: 120 IU/L (ref 44–121)
BUN/Creatinine Ratio: 14 (ref 12–28)
BUN: 10 mg/dL (ref 8–27)
Bilirubin Total: 0.4 mg/dL (ref 0.0–1.2)
CO2: 24 mmol/L (ref 20–29)
Calcium: 9.9 mg/dL (ref 8.7–10.3)
Chloride: 103 mmol/L (ref 96–106)
Creatinine, Ser: 0.72 mg/dL (ref 0.57–1.00)
GFR calc Af Amer: 99 mL/min/{1.73_m2} (ref >59)
GFR calc non Af Amer: 86 mL/min/{1.73_m2} (ref >59)
Globulin, Total: 2.5 g/dL (ref 1.5–4.5)
Glucose: 96 mg/dL (ref 65–99)
Potassium: 5.1 mmol/L (ref 3.5–5.2)
Sodium: 143 mmol/L (ref 134–144)
Total Protein: 7.3 g/dL (ref 6.0–8.5)

## 2020-10-06 LAB — TSH: TSH: 0.962 u[IU]/mL (ref 0.450–4.500)

## 2020-10-06 LAB — LIPID PANEL
Chol/HDL Ratio: 2.2 ratio (ref 0.0–4.4)
Cholesterol, Total: 252 mg/dL — ABNORMAL HIGH (ref 100–199)
HDL: 115 mg/dL (ref >39)
LDL Chol Calc (NIH): 123 mg/dL — ABNORMAL HIGH (ref 0–99)
Triglycerides: 84 mg/dL (ref 0–149)
VLDL Cholesterol Cal: 14 mg/dL (ref 5–40)

## 2020-10-06 LAB — CBC WITH DIFFERENTIAL/PLATELET
Basophils Absolute: 0.1 10*3/uL (ref 0.0–0.2)
Basos: 1 %
EOS (ABSOLUTE): 0 10*3/uL (ref 0.0–0.4)
Eos: 1 %
Hematocrit: 41.7 % (ref 34.0–46.6)
Hemoglobin: 13.6 g/dL (ref 11.1–15.9)
Immature Grans (Abs): 0 10*3/uL (ref 0.0–0.1)
Immature Granulocytes: 0 %
Lymphocytes Absolute: 2.1 10*3/uL (ref 0.7–3.1)
Lymphs: 27 %
MCH: 31.1 pg (ref 26.6–33.0)
MCHC: 32.6 g/dL (ref 31.5–35.7)
MCV: 95 fL (ref 79–97)
Monocytes Absolute: 0.5 10*3/uL (ref 0.1–0.9)
Monocytes: 7 %
Neutrophils Absolute: 5.1 10*3/uL (ref 1.4–7.0)
Neutrophils: 64 %
Platelets: 239 10*3/uL (ref 150–450)
RBC: 4.38 x10E6/uL (ref 3.77–5.28)
RDW: 13 % (ref 11.7–15.4)
WBC: 7.8 10*3/uL (ref 3.4–10.8)

## 2020-10-06 LAB — VITAMIN B12: Vitamin B-12: 386 pg/mL (ref 232–1245)

## 2020-10-06 LAB — VITAMIN D 25 HYDROXY (VIT D DEFICIENCY, FRACTURES): Vit D, 25-Hydroxy: 17.1 ng/mL — ABNORMAL LOW (ref 30.0–100.0)

## 2020-10-06 MED ORDER — VITAMIN D (ERGOCALCIFEROL) 1.25 MG (50000 UNIT) PO CAPS
50000.0000 [IU] | ORAL_CAPSULE | ORAL | 3 refills | Status: DC
Start: 2020-10-06 — End: 2023-02-26

## 2020-10-08 ENCOUNTER — Ambulatory Visit
Admission: RE | Admit: 2020-10-08 | Discharge: 2020-10-08 | Disposition: A | Discharge status: Home / Self Care | Payer: Medicare (Managed Care) | Source: Ambulatory Visit | Attending: Family Medicine | Admitting: Family Medicine

## 2020-10-08 ENCOUNTER — Other Ambulatory Visit: Payer: Self-pay

## 2020-10-08 DIAGNOSIS — Z1231 Encounter for screening mammogram for malignant neoplasm of breast: Secondary | ICD-10-CM

## 2020-10-21 ENCOUNTER — Other Ambulatory Visit: Payer: Self-pay

## 2020-10-21 DIAGNOSIS — J40 Bronchitis, not specified as acute or chronic: Secondary | ICD-10-CM

## 2020-10-21 MED ORDER — ALBUTEROL SULFATE HFA 108 (90 BASE) MCG/ACT IN AERS: 2.0000 | INHALATION_SPRAY | Freq: Four times a day (QID) | RESPIRATORY_TRACT | 0 refills | Status: AC | PRN

## 2020-10-27 ENCOUNTER — Telehealth: Payer: Self-pay | Admitting: Family Medicine

## 2020-10-27 ENCOUNTER — Other Ambulatory Visit: Payer: Self-pay | Admitting: Family Medicine

## 2020-10-27 NOTE — Telephone Encounter (Signed)
Please advise 

## 2020-10-27 NOTE — Telephone Encounter (Signed)
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 Pt uses allergy pill with no help. Pt uses other CDC precautions / cleaning.  Call pt [phone]

## 2020-10-28 ENCOUNTER — Other Ambulatory Visit: Payer: Self-pay | Admitting: Family Medicine

## 2020-10-28 ENCOUNTER — Encounter: Payer: Self-pay | Admitting: Family Medicine

## 2020-10-28 DIAGNOSIS — R928 Other abnormal and inconclusive findings on diagnostic imaging of breast: Secondary | ICD-10-CM

## 2020-11-11 ENCOUNTER — Ambulatory Visit: Payer: Medicare (Managed Care)

## 2020-11-11 ENCOUNTER — Other Ambulatory Visit: Payer: Self-pay

## 2020-11-11 ENCOUNTER — Ambulatory Visit
Admission: RE | Admit: 2020-11-11 | Discharge: 2020-11-11 | Disposition: A | Discharge status: Home / Self Care | Payer: Medicare (Managed Care) | Source: Ambulatory Visit | Attending: Family Medicine | Admitting: Family Medicine

## 2020-11-11 DIAGNOSIS — R928 Other abnormal and inconclusive findings on diagnostic imaging of breast: Secondary | ICD-10-CM

## 2020-12-07 ENCOUNTER — Ambulatory Visit (HOSPITAL_COMMUNITY)
Admission: EM | Admit: 2020-12-07 | Discharge: 2020-12-07 | Disposition: A | Discharge status: Home / Self Care | Payer: Medicare (Managed Care) | Attending: Student | Admitting: Student

## 2020-12-07 ENCOUNTER — Other Ambulatory Visit: Payer: Self-pay

## 2020-12-07 ENCOUNTER — Encounter (HOSPITAL_COMMUNITY): Payer: Self-pay

## 2020-12-07 DIAGNOSIS — J069 Acute upper respiratory infection, unspecified: Secondary | ICD-10-CM | POA: Insufficient documentation

## 2020-12-07 DIAGNOSIS — Z1152 Encounter for screening for COVID-19: Secondary | ICD-10-CM | POA: Diagnosis not present

## 2020-12-07 DIAGNOSIS — J209 Acute bronchitis, unspecified: Secondary | ICD-10-CM | POA: Diagnosis not present

## 2020-12-07 DIAGNOSIS — D860 Sarcoidosis of lung: Secondary | ICD-10-CM | POA: Diagnosis present

## 2020-12-07 MED ORDER — AZITHROMYCIN 250 MG PO TABS: 250.0000 mg | ORAL_TABLET | Freq: Every day | ORAL | 0 refills | Status: DC

## 2020-12-07 MED ORDER — PROMETHAZINE-DM 6.25-15 MG/5ML PO SYRP: 5.0000 mL | ORAL_SOLUTION | Freq: Four times a day (QID) | ORAL | 0 refills | Status: DC | PRN

## 2020-12-07 MED ORDER — PREDNISONE 10 MG (21) PO TBPK: ORAL_TABLET | Freq: Every day | ORAL | 0 refills | Status: DC

## 2020-12-07 NOTE — ED Triage Notes (Signed)
Pt presents with congestion, sinus pain & pressure, nasal drainage, throat irritation from post nasal drip, and non productive cough X 2 days that is unrelieved with OTC medications.

## 2020-12-07 NOTE — ED Provider Notes (Signed)
[place] [facility]    CSN: [id number] Arrival date & time: 12/07/20  1040      History   Chief Complaint Chief Complaint  Patient presents with  . URI  . Cough    HPI Deanna Knight is a 70 y.o. female presenting for viral URI symptoms.  History seasonal allergies, pneumothorax, sarcoidosis, tubal ligation.  Endorses hacking cough, congestion, generalized weakness, post nasal drip. Has been using albuterol inhaler with relief.  History of pulmonary sarcoidosis and pneumothorax but states she has never had pneumonia.  States she is up-to-date on her pneumonia, flu, Covid vaccinations.  Benadryl and over-the-counter cough medications are providing relief.  Denies fevers/chills, n/v/d, shortness of breath, chest pain, facial pain, teeth pain, headaches, sore throat, loss of taste/smell, swollen lymph nodes, ear pain.  Has not taken antipyretic today.   HPI  Past Medical History:  Diagnosis Date  . Allergy    seasonal  . Hypercholesteremia   . Pneumothorax   . Sarcoidosis   . Vitamin D deficiency 09/2020    Patient Active Problem List   Diagnosis Date Noted  . Pap test, as part of routine gynecological examination 01/09/2017  . Hypercholesterolemia 10/11/2016  . Hyperglycemia 10/11/2016  . Blurred vision, left eye 10/11/2016    Past Surgical History:  Procedure Laterality Date  . LUNG SURGERY     denies surgery 03/19/19  . TUBAL LIGATION      OB History   No obstetric history on file.      Home Medications    Prior to Admission medications   Medication Sig Start Date End Date Taking? Authorizing Provider  azithromycin (ZITHROMAX Z-PAK) 250 MG tablet Take 1 tablet (250 mg total) by mouth daily. Z-pack: take two pills today. Take one pill per day for the next 4 days. 12/07/20  Yes Hazel Sams, PA-C  predniSONE (STERAPRED UNI-PAK 21 TAB) 10 MG (21) TBPK tablet Take by mouth daily. Take 6 tabs by mouth daily  for 2 days, then 5 tabs for 2 days, then 4 tabs for  2 days, then 3 tabs for 2 days, 2 tabs for 2 days, then 1 tab by mouth daily for 2 days 12/07/20  Yes Hazel Sams, PA-C  promethazine-dextromethorphan (PROMETHAZINE-DM) 6.25-15 MG/5ML syrup Take 5 mLs by mouth 4 (four) times daily as needed for cough. 12/07/20  Yes Hazel Sams, PA-C  albuterol (VENTOLIN HFA) 108 (90 Base) MCG/ACT inhaler Inhale 2 puffs into the lungs every 6 (six) hours as needed for wheezing or shortness of breath. Dispense with aerochamber 10/21/20   Dorena Dew, FNP  diphenhydrAMINE (BENYLIN) 12.5 MG/5ML syrup Take 2.5 mLs (6.25 mg total) by mouth 4 (four) times daily as needed for allergies. Patient not taking: Reported on 10/05/2020 11/12/17   Dorena Dew, FNP  erythromycin ophthalmic ointment Place a 1/2 inch ribbon of ointment into the lower eyelid up to 6 times a day. 03/17/20   Loura Halt A, NP  Multiple Vitamins-Minerals (CENTRUM ADULTS) TABS Take by mouth daily.    [provider]  NON FORMULARY Boiron    [provider]  Vitamin D, Ergocalciferol, (DRISDOL) 1.25 MG (50000 UNIT) CAPS capsule Take 1 capsule (50,000 Units total) by mouth every 7 (seven) days. 10/06/20   Azzie Glatter, FNP    Family History Family History  Problem Relation Age of Onset  . Stroke Mother   . Heart disease Mother   . Heart disease Father   . Stroke Sister   .  Heart disease Sister   . Leukemia Sister   . Stroke Brother   . Heart disease Brother   . Lung cancer Paternal Aunt   . Colon cancer Neg Hx   . Colon polyps Neg Hx   . Esophageal cancer Neg Hx   . Rectal cancer Neg Hx   . Stomach cancer Neg Hx     Social History Social History   Tobacco Use  . Smoking status: Former Smoker    Years: 1.00  . Smokeless tobacco: Never Used  Vaping Use  . Vaping Use: Never used  Substance Use Topics  . Alcohol use: Yes    Comment: wine holidays  . Drug use: No     Allergies   Elemental sulfur and Sulfa antibiotics   Review of Systems Review of  Systems  Constitutional: Positive for fatigue. Negative for appetite change, chills and fever.  HENT: Positive for congestion. Negative for ear pain, rhinorrhea, sinus pressure, sinus pain and sore throat.   Eyes: Negative for redness and visual disturbance.  Respiratory: Positive for cough. Negative for chest tightness, shortness of breath and wheezing.   Cardiovascular: Negative for chest pain and palpitations.  Gastrointestinal: Negative for abdominal pain, constipation, diarrhea, nausea and vomiting.  Genitourinary: Negative for dysuria, frequency and urgency.  Musculoskeletal: Negative for myalgias.  Neurological: Positive for weakness. Negative for dizziness, tremors, seizures, syncope, speech difficulty, light-headedness, numbness and headaches.  Psychiatric/Behavioral: Negative for confusion.  All other systems reviewed and are negative.    Physical Exam Triage Vital Signs ED Triage Vitals  Enc Vitals Group     BP      Pulse      Resp      Temp      Temp src      SpO2      Weight      Height      Head Circumference      Peak Flow      Pain Score      Pain Loc      Pain Edu?      Excl. in [place]?    No data found.  Updated Vital Signs BP 138/68 (BP Location: Right Arm)   Pulse 74   Temp 98.4 F (36.9 C)   Resp 17   SpO2 98%   Visual Acuity Right Eye Distance:   Left Eye Distance:   Bilateral Distance:    Right Eye Near:   Left Eye Near:    Bilateral Near:     Physical Exam Vitals reviewed.  Constitutional:      General: She is not in acute distress.    Appearance: Normal appearance. She is not ill-appearing.  HENT:     Head: Normocephalic and atraumatic.     Right Ear: Hearing, tympanic membrane, ear canal and external ear normal. No swelling or tenderness. There is no impacted cerumen. No mastoid tenderness. Tympanic membrane is not perforated, erythematous, retracted or bulging.     Left Ear: Hearing, tympanic membrane, ear canal and external ear  normal. No swelling or tenderness. There is no impacted cerumen. No mastoid tenderness. Tympanic membrane is not perforated, erythematous, retracted or bulging.     Nose:     Right Sinus: No maxillary sinus tenderness or frontal sinus tenderness.     Left Sinus: No maxillary sinus tenderness or frontal sinus tenderness.     Mouth/Throat:     Mouth: Mucous membranes are moist.     Pharynx: Uvula midline. No oropharyngeal exudate  or posterior oropharyngeal erythema.     Tonsils: No tonsillar exudate.  Eyes:     Extraocular Movements: Extraocular movements intact.     Pupils: Pupils are equal, round, and reactive to light.  Cardiovascular:     Rate and Rhythm: Normal rate and regular rhythm.     Heart sounds: Normal heart sounds.  Pulmonary:     Effort: No tachypnea, bradypnea, accessory muscle usage, prolonged expiration or respiratory distress.     Breath sounds: Normal breath sounds and air entry. No decreased breath sounds, wheezing, rhonchi or rales.     Comments: Frequent hacking cough. Chest:     Chest wall: No tenderness.  Abdominal:     General: Abdomen is flat. Bowel sounds are normal.     Tenderness: There is no abdominal tenderness. There is no guarding or rebound.  Lymphadenopathy:     Cervical: No cervical adenopathy.  Skin:    Capillary Refill: Capillary refill takes less than 2 seconds.  Neurological:     General: No focal deficit present.     Mental Status: She is alert and oriented to person, place, and time.  Psychiatric:        Attention and Perception: Attention and perception normal.        Mood and Affect: Mood and affect normal.        Behavior: Behavior normal. Behavior is cooperative.        Thought Content: Thought content normal.        Judgment: Judgment normal.      UC Treatments / Results  Labs (all labs ordered are listed, but only abnormal results are displayed) Labs Reviewed  SARS CORONAVIRUS 2 (TAT 6-24 HRS)    EKG   Radiology No  results found.  Procedures Procedures (including critical care time)  Medications Ordered in UC Medications - No data to display  Initial Impression / Assessment and Plan / UC Course  I have reviewed the triage vital signs and the nursing notes.  Pertinent labs & imaging results that were available during my care of the patient were reviewed by me and considered in my medical decision making (see chart for details).     This patient is a 70 year old female presenting with acute bronchitis and viral illness. Today this pt is afebrile nontachycardic nontachypneic, oxygenating well on room air, no wheezes rhonchi or rales.  Appears comfortable and oxygenating well on room air.  For acute bronchitis- given history of pulmonary sarcoidosis, plan to treat with Z-Pak and prednisone taper as below.  Low suspicion for pneumonia given lack of fevers, shortness of breath.  Promethazine DM sent.  Covid PCR sent, isolate as per CDC guidelines.  Return precautions discussed.  Patient verbalizes understanding agreement.  This chart was dictated using voice recognition software, Dragon. Despite the best efforts of this provider to proofread and correct errors, errors may still occur which can change documentation meaning.   Final Clinical Impressions(s) / UC Diagnoses   Final diagnoses:  Acute bronchitis, unspecified organism  Encounter for screening for COVID-19  Pulmonary sarcoidosis ([place])  Viral URI with cough     Discharge Instructions     -For your bronchitis, start the prednisone taper as directed. -Also start the Z-Pak.  -Continue albuterol inhaler. -Promethazine DM cough syrup for congestion/cough. This could make you drowsy, so take at night before bed. -If your symptoms get worse instead of better, seek additional immediate medical attention.  This includes shortness of breath, new fever/chills, chest pain, dizziness. -Your  Covid test should come back in about 1 day.  This will  go to your email and my chart, but you can call us if you have any questions or concerns. -Wait until you have been feeling better for 1 to 2 weeks before getting a Covid booster shot.    ED Prescriptions    Medication Sig Dispense Auth. Provider   predniSONE (STERAPRED UNI-PAK 21 TAB) 10 MG (21) TBPK tablet Take by mouth daily. Take 6 tabs by mouth daily  for 2 days, then 5 tabs for 2 days, then 4 tabs for 2 days, then 3 tabs for 2 days, 2 tabs for 2 days, then 1 tab by mouth daily for 2 days 42 tablet Marin Roberts E, PA-C   azithromycin (ZITHROMAX Z-PAK) 250 MG tablet Take 1 tablet (250 mg total) by mouth daily. Z-pack: take two pills today. Take one pill per day for the next 4 days. 6 tablet Hazel Sams, PA-C   promethazine-dextromethorphan (PROMETHAZINE-DM) 6.25-15 MG/5ML syrup Take 5 mLs by mouth 4 (four) times daily as needed for cough. 118 mL Hazel Sams, PA-C     PDMP not reviewed this encounter.   Hazel Sams, PA-C 12/07/20 1211

## 2020-12-07 NOTE — Discharge Instructions (Addendum)
-  For your bronchitis, start the prednisone taper as directed. -Also start the Z-Pak.  -Continue albuterol inhaler. -Promethazine DM cough syrup for congestion/cough. This could make you drowsy, so take at night before bed. -If your symptoms get worse instead of better, seek additional immediate medical attention.  This includes shortness of breath, new fever/chills, chest pain, dizziness. -Your Covid test should come back in about 1 day.  This will go to your email and my chart, but you can call us if you have any questions or concerns. -Wait until you have been feeling better for 1 to 2 weeks before getting a Covid booster shot.

## 2020-12-08 LAB — SARS CORONAVIRUS 2 (TAT 6-24 HRS): SARS Coronavirus 2: NEGATIVE

## 2021-01-03 ENCOUNTER — Ambulatory Visit: Payer: Medicare (Managed Care) | Admitting: Family Medicine

## 2021-01-05 ENCOUNTER — Ambulatory Visit: Payer: Medicare (Managed Care) | Admitting: Nurse Practitioner

## 2021-01-25 ENCOUNTER — Emergency Department (HOSPITAL_COMMUNITY)
Admission: EM | Admit: 2021-01-25 | Discharge: 2021-01-25 | Disposition: A | Discharge status: Home / Self Care | Payer: Medicare (Managed Care) | Attending: Emergency Medicine | Admitting: Emergency Medicine

## 2021-01-25 ENCOUNTER — Other Ambulatory Visit: Payer: Self-pay

## 2021-01-25 ENCOUNTER — Emergency Department (HOSPITAL_COMMUNITY): Payer: Medicare (Managed Care)

## 2021-01-25 ENCOUNTER — Encounter (HOSPITAL_COMMUNITY): Payer: Self-pay

## 2021-01-25 DIAGNOSIS — R072 Precordial pain: Secondary | ICD-10-CM | POA: Diagnosis present

## 2021-01-25 DIAGNOSIS — Z87891 Personal history of nicotine dependence: Secondary | ICD-10-CM | POA: Insufficient documentation

## 2021-01-25 DIAGNOSIS — R55 Syncope and collapse: Secondary | ICD-10-CM | POA: Diagnosis not present

## 2021-01-25 DIAGNOSIS — R059 Cough, unspecified: Secondary | ICD-10-CM | POA: Diagnosis not present

## 2021-01-25 DIAGNOSIS — R079 Chest pain, unspecified: Secondary | ICD-10-CM

## 2021-01-25 DIAGNOSIS — R0981 Nasal congestion: Secondary | ICD-10-CM | POA: Diagnosis not present

## 2021-01-25 LAB — CBC
HCT: 43.4 % (ref 36.0–46.0)
Hemoglobin: 13.7 g/dL (ref 12.0–15.0)
MCH: 31.6 pg (ref 26.0–34.0)
MCHC: 31.6 g/dL (ref 30.0–36.0)
MCV: 100 fL (ref 80.0–100.0)
Platelets: 147 10*3/uL — ABNORMAL LOW (ref 150–400)
RBC: 4.34 MIL/uL (ref 3.87–5.11)
RDW: 13.5 % (ref 11.5–15.5)
WBC: 4.7 10*3/uL (ref 4.0–10.5)
nRBC: 0 % (ref 0.0–0.2)

## 2021-01-25 LAB — BASIC METABOLIC PANEL
Anion gap: 8 (ref 5–15)
BUN: 12 mg/dL (ref 8–23)
CO2: 25 mmol/L (ref 22–32)
Calcium: 8.9 mg/dL (ref 8.9–10.3)
Chloride: 106 mmol/L (ref 98–111)
Creatinine, Ser: 0.75 mg/dL (ref 0.44–1.00)
GFR, Estimated: >60 mL/min (ref >60)
Glucose, Bld: 133 mg/dL — ABNORMAL HIGH (ref 70–99)
Potassium: 3.4 mmol/L — ABNORMAL LOW (ref 3.5–5.1)
Sodium: 139 mmol/L (ref 135–145)

## 2021-01-25 LAB — TROPONIN I (HIGH SENSITIVITY)
Troponin I (High Sensitivity): 2 ng/L (ref <18)
Troponin I (High Sensitivity): 2 ng/L (ref <18)

## 2021-01-25 MED ORDER — LIDOCAINE VISCOUS HCL 2 % MT SOLN
15.0000 mL | Freq: Once | OROMUCOSAL | Status: AC
  Administered 2021-01-25: 15 mL via ORAL
  Filled 2021-01-25: qty 15

## 2021-01-25 MED ORDER — ALUM & MAG HYDROXIDE-SIMETH 200-200-20 MG/5ML PO SUSP
30.0000 mL | Freq: Once | ORAL | Status: AC
  Administered 2021-01-25: 30 mL via ORAL
  Filled 2021-01-25: qty 30

## 2021-01-25 NOTE — ED Notes (Signed)
Pt gone to xray

## 2021-01-25 NOTE — ED Triage Notes (Addendum)
Patient states she at work and began having chest pain and then she she passed out. Patient states, "I haven't felt good x 4 days."  Patient denies any nausea or SOB.  Patient states she had bronchitis 6 weeks ago and has had to use her Albuterol inhaler since yesterday. Patient also c/o nasal congestion and a headache.

## 2021-01-25 NOTE — Discharge Instructions (Signed)
Your work-up today was reassuring.  I am glad you are feeling better. I recommend follow-up with your primary care doctor.  Given the story that you told me and your reassuring work-up I have some suspicion for an esophageal spasm which can cause severe pain.   You may always return to the ER for any new or concerning symptoms.  Drink plenty of water.

## 2021-01-25 NOTE — ED Provider Notes (Signed)
[facility] DEPT Provider Note   CSN: [id number] Arrival date & time: 01/25/21  0831     History Chief Complaint  Patient presents with  . Chest Pain  . Loss of Consciousness    Deanna Knight is a 70 y.o. female.  HPI Patient is a 70 year old female with a past medical history of seasonal allergies HLD and sarcoidosis  Patient is presenting today with some cough congestion runny nose for the past 4 days.  She states her symptoms were improving over the past 2 or 3 days and states that she woke up feeling quite well this morning.  She states that about 1 hour prior to arrival in the ER she was defervescing a frozen hamburger patty and took a bite of this and she states that when she swallows she had sudden pain that started in her neck and slowly slid down to her sternum.  She states that she walked over to a chair and sat down she states that she feels that she nodded off or passed out for a second is when she felt she woke up and was still having chest pain.  She states that it is gradually improved since that time.  She denies any nausea vomiting LH, or dizziness  Sx are improving.   No other associated sx.     Past Medical History:  Diagnosis Date  . Allergy    seasonal  . Hypercholesteremia   . Pneumothorax   . Sarcoidosis   . Vitamin D deficiency 09/2020    Patient Active Problem List   Diagnosis Date Noted  . Pap test, as part of routine gynecological examination 01/09/2017  . Hypercholesterolemia 10/11/2016  . Hyperglycemia 10/11/2016  . Blurred vision, left eye 10/11/2016    Past Surgical History:  Procedure Laterality Date  . LUNG SURGERY     denies surgery 03/19/19  . TUBAL LIGATION       OB History   No obstetric history on file.     Family History  Problem Relation Age of Onset  . Stroke Mother   . Heart disease Mother   . Heart disease Father   . Stroke Sister   . Heart disease Sister   . Leukemia Sister   .  Stroke Brother   . Heart disease Brother   . Lung cancer Paternal Aunt   . Colon cancer Neg Hx   . Colon polyps Neg Hx   . Esophageal cancer Neg Hx   . Rectal cancer Neg Hx   . Stomach cancer Neg Hx     Social History   Tobacco Use  . Smoking status: Former Smoker    Years: 1.00  . Smokeless tobacco: Never Used  Vaping Use  . Vaping Use: Never used  Substance Use Topics  . Alcohol use: Yes    Comment: wine holidays  . Drug use: No    Home Medications Prior to Admission medications   Medication Sig Start Date End Date Taking? Authorizing Provider  albuterol (VENTOLIN HFA) 108 (90 Base) MCG/ACT inhaler Inhale 2 puffs into the lungs every 6 (six) hours as needed for wheezing or shortness of breath. Dispense with aerochamber 10/21/20  Yes Dorena Dew, FNP  fexofenadine (ALLEGRA) 180 MG tablet Take 180 mg by mouth daily as needed for allergies or rhinitis.   Yes [provider]  Multiple Vitamins-Minerals (CENTRUM ADULTS) TABS Take 1 tablet by mouth daily.   Yes [provider]  naproxen sodium (ALEVE) 220 MG tablet  Take 220 mg by mouth daily as needed (pain).   Yes [provider]  NON FORMULARY Place 1 tablet under the tongue daily. Boiron   Yes [provider]  Phenyleph-Doxylamine-DM-APAP (NYQUIL SEVERE COLD/FLU) 5-6.25-10-325 MG/15ML LIQD Take 15 mLs by mouth every 6 (six) hours as needed (cough).   Yes [provider]  PREVIDENT 0.2 % SOLN Take 5 mLs by mouth as directed. Swish and spit 10/21/20  Yes [provider]  Pseudoeph-Doxylamine-DM-APAP (DAYQUIL/NYQUIL COLD/FLU RELIEF PO) Take 2 tablets by mouth every 6 (six) hours as needed (cold symptoms).   Yes [provider]  azithromycin (ZITHROMAX Z-PAK) 250 MG tablet Take 1 tablet (250 mg total) by mouth daily. Z-pack: take two pills today. Take one pill per day for the next 4 days. Patient not taking: No sig reported 12/07/20   Hazel Sams, PA-C   diphenhydrAMINE [facility]) 12.5 MG/5ML syrup Take 2.5 mLs (6.25 mg total) by mouth 4 (four) times daily as needed for allergies. Patient not taking: No sig reported 11/12/17   Dorena Dew, FNP  predniSONE (STERAPRED UNI-PAK 21 TAB) 10 MG (21) TBPK tablet Take by mouth daily. Take 6 tabs by mouth daily  for 2 days, then 5 tabs for 2 days, then 4 tabs for 2 days, then 3 tabs for 2 days, 2 tabs for 2 days, then 1 tab by mouth daily for 2 days Patient not taking: No sig reported 12/07/20   Hazel Sams, PA-C  promethazine-dextromethorphan (PROMETHAZINE-DM) 6.25-15 MG/5ML syrup Take 5 mLs by mouth 4 (four) times daily as needed for cough. Patient not taking: No sig reported 12/07/20   Hazel Sams, PA-C  Vitamin D, Ergocalciferol, (DRISDOL) 1.25 MG (50000 UNIT) CAPS capsule Take 1 capsule (50,000 Units total) by mouth every 7 (seven) days. 10/06/20   Azzie Glatter, FNP    Allergies    Elemental sulfur and Sulfa antibiotics  Review of Systems   Review of Systems  Constitutional: Negative for chills and fever.  HENT: Negative for congestion.   Eyes: Negative for pain.  Respiratory: Negative for cough and shortness of breath.   Cardiovascular: Positive for chest pain. Negative for leg swelling.  Gastrointestinal: Negative for abdominal pain, diarrhea, nausea and vomiting.  Genitourinary: Negative for dysuria.  Musculoskeletal: Negative for myalgias.  Skin: Negative for rash.  Neurological: Negative for dizziness and headaches.    Physical Exam Updated Vital Signs BP 117/77   Pulse 61   Temp 98.9 F (37.2 C) (Oral)   Resp 20   Ht 5' 7.5" (1.715 m)   Wt 61.2 kg   SpO2 99%   BMI 20.83 kg/m   Physical Exam Vitals and nursing note reviewed.  Constitutional:      General: She is not in acute distress. HENT:     Head: Normocephalic and atraumatic.     Nose: Nose normal.     Mouth/Throat:     Mouth: Mucous membranes are moist.  Eyes:     General: No scleral  icterus. Cardiovascular:     Rate and Rhythm: Normal rate and regular rhythm.     Pulses: Normal pulses.     Heart sounds: Normal heart sounds.  Pulmonary:     Effort: Pulmonary effort is normal. No respiratory distress.     Breath sounds: No wheezing.  Abdominal:     Palpations: Abdomen is soft.     Tenderness: There is no abdominal tenderness. There is no guarding or rebound.  Musculoskeletal:  Cervical back: Normal range of motion.     Right lower leg: No edema.     Left lower leg: No edema.     Comments: No lower extremity edema or calf tenderness.  Skin:    General: Skin is warm and dry.     Capillary Refill: Capillary refill takes less than 2 seconds.  Neurological:     Mental Status: She is alert. Mental status is at baseline.  Psychiatric:        Mood and Affect: Mood normal.        Behavior: Behavior normal.     ED Results / Procedures / Treatments   Labs (all labs ordered are listed, but only abnormal results are displayed) Labs Reviewed  BASIC METABOLIC PANEL - Abnormal; Notable for the following components:      Result Value   Potassium 3.4 (*)    Glucose, Bld 133 (*)    All other components within normal limits  CBC - Abnormal; Notable for the following components:   Platelets 147 (*)    All other components within normal limits  TROPONIN I (HIGH SENSITIVITY)  TROPONIN I (HIGH SENSITIVITY)    EKG EKG Interpretation  Date/Time:  Tuesday Jan 25 2021 09:16:00 EDT Ventricular Rate:  68 PR Interval:  117 QRS Duration: 82 QT Interval:  390 QTC Calculation: 415 R Axis:   62 Text Interpretation: Sinus rhythm Borderline short PR interval Abnormal T, consider ischemia, diffuse leads Confirmed by Dewaine Conger [phone]) on 01/25/2021 12:26:16 PM   Radiology DG Chest 2 View  Result Date: 01/25/2021 CLINICAL DATA:  Chest pain EXAM: CHEST - 2 VIEW COMPARISON:  10/05/2017 FINDINGS: Heart and mediastinal contours are within normal limits. No focal opacities or  effusions. No acute bony abnormality. IMPRESSION: No active cardiopulmonary disease. Electronically Signed   By: Rolm Baptise M.D.   On: 01/25/2021 09:16    Procedures Procedures   Medications Ordered in ED Medications  alum & mag hydroxide-simeth (MAALOX/MYLANTA) 200-200-20 MG/5ML suspension 30 mL (30 mLs Oral Given 01/25/21 1129)    And  lidocaine (XYLOCAINE) 2 % viscous mouth solution 15 mL (15 mLs Oral Given 01/25/21 1129)    ED Course  I have reviewed the triage vital signs and the nursing notes.  Pertinent labs & imaging results that were available during my care of the patient were reviewed by me and considered in my medical decision making (see chart for details).    MDM Rules/Calculators/A&P                          Patient is a well-appearing 70 year old female with no cardiac history with specific medical history detailed in HPI presented today with sternal chest pain that has occurred immediately after she swallowed a piece of burger.  She states that it has been improving since that time.  She also had a brief syncopal episode this seems to have happened after she sat down in a chair.  She states that she fell asleep and woke back up she did not fall out of the chair nor did she fall forward onto the table.  She denies any head injury.  No nausea or vomiting.  No shortness of breath.  Given that her symptoms have gradually improved since that time.  Given that her symptoms also initially began with swallowing a piece of burger I have a relatively high suspicion for esophageal spasm/nutcracker esophagus, or other GI etiology of her symptoms.  I specifically  doubt cardiac cause such as ACS, tamponade, dissection.  Also low suspicion for pulmonary embolism given that her history is not very consistent with PE and her pain is more sternal and seems to be associated with her swallowing the burger.  She is not tachycardic, hypoxic or tachypneic.  She is having no shortness of  breath.  Overall she is very well-appearing.  CBC and BMP unremarkable.  Troponin x2 within normal limits.  Chest x-ray unremarkable and EKG with some minor lateral T wave inversions that are nonspecific but appear to be changed from 4 years ago.  I discussed this case with my attending physician who cosigned this note including patient's presenting symptoms, physical exam, and planned diagnostics and interventions. Attending physician stated agreement with plan or made changes to plan which were implemented.   Patient given some reassurance I have reassessed her and her symptoms are now completely resolved after GI cocktail.  She has received no other medications. She is agreeable to discharge home at this time.  She will follow-up closely for PCP and return immediately to the ER for any new or concerning symptoms.   Deanna Knight was evaluated in Emergency Department on 01/25/2021 for the symptoms described in the history of present illness. She was evaluated in the context of the global COVID-19 pandemic, which necessitated consideration that the patient might be at risk for infection with the SARS-CoV-2 virus that causes COVID-19. Institutional protocols and algorithms that pertain to the evaluation of patients at risk for COVID-19 are in a state of rapid change based on information released by regulatory bodies including the CDC and federal and state organizations. These policies and algorithms were followed during the patient's care in the ED.   Final Clinical Impression(s) / ED Diagnoses Final diagnoses:  Chest pain, unspecified type  Syncope, unspecified syncope type    Rx / DC Orders ED Discharge Orders    None       Tedd Sias, [place] 01/25/21 1512    Breck Coons, MD 01/26/21 1353

## 2021-01-27 ENCOUNTER — Ambulatory Visit: Payer: Medicare (Managed Care) | Admitting: Nurse Practitioner

## 2021-01-27 ENCOUNTER — Other Ambulatory Visit: Payer: Self-pay

## 2021-01-27 VITALS — BP 122/66 | HR 69 | Temp 97.0°F | Ht 66.5 in | Wt 129.0 lb

## 2021-01-27 DIAGNOSIS — R1013 Epigastric pain: Secondary | ICD-10-CM

## 2021-01-27 DIAGNOSIS — R079 Chest pain, unspecified: Secondary | ICD-10-CM | POA: Diagnosis not present

## 2021-01-27 DIAGNOSIS — Z8249 Family history of ischemic heart disease and other diseases of the circulatory system: Secondary | ICD-10-CM

## 2021-01-27 DIAGNOSIS — E7800 Pure hypercholesterolemia, unspecified: Secondary | ICD-10-CM | POA: Diagnosis not present

## 2021-01-27 DIAGNOSIS — R197 Diarrhea, unspecified: Secondary | ICD-10-CM

## 2021-01-27 MED ORDER — BENZONATATE 100 MG PO CAPS: 100.0000 mg | ORAL_CAPSULE | Freq: Three times a day (TID) | ORAL | 0 refills | Status: AC | PRN

## 2021-01-27 NOTE — Progress Notes (Signed)
[place], [place]  [address] Phone:  [phone]   Fax:  [phone]   Established Patient Office Visit  Subjective:  Patient ID: Deanna Knight, female    DOB: Nov 08, 1950  Age: 70 y.o. MRN: [id number]  CC:  Chief Complaint  Patient presents with  . Follow-up    ED visit 5/17, chest pain and loss of consciousness. Has been feeling a bit better.    HPI Deanna Knight presents for follow up. .A former patient of NP [facility]. She  has a past medical history of Allergy, Hypercholesteremia, Pneumothorax, Sarcoidosis, and Vitamin D deficiency (09/2020).   She was seen in the ED for chest pain and weakness with LOC. She reports that this was happened while she was at [employer]. EMS was called. She was treated and released. She felt like a similar incident may have happened in the past. She has been fearful of eating because this may precipitate another attack. She was eating a plain [employer] when this occurred.She does not remember everything that took place. She does remember waking and requesting help. She was having increased weakness.  She has had some mild pain. She has had persistent cough. She doe not feel like her medication has been effective.  She has noticed some recurrent diarrhea. She is SP colonoscopy which is due next year due to positive colon polyps. She reports family members; Siblings with heart disease including heart attack.   She is concern about her breast. She is in limbo for a follow up due to insurance. She reports an abnormal mammogram with follow up. However on the day of repeat imaging. She was informed that insurance would not cover.   She reports that she has had ear pain . Past Medical History:  Diagnosis Date  . Allergy    seasonal  . Hypercholesteremia   . Pneumothorax   . Sarcoidosis   . Vitamin D deficiency 09/2020    Past Surgical History:  Procedure Laterality Date  . LUNG SURGERY      denies surgery 03/19/19  . TUBAL LIGATION      Family History  Problem Relation Age of Onset  . Stroke Mother   . Heart disease Mother   . Heart disease Father   . Stroke Sister   . Heart disease Sister   . Leukemia Sister   . Stroke Brother   . Heart disease Brother   . Lung cancer Paternal Aunt   . Colon cancer Neg Hx   . Colon polyps Neg Hx   . Esophageal cancer Neg Hx   . Rectal cancer Neg Hx   . Stomach cancer Neg Hx     Social History   Socioeconomic History  . Marital status: Divorced    Spouse name: Not on file  . Number of children: Not on file  . Years of education: Not on file  . Highest education level: Not on file  Occupational History  . Not on file  Tobacco Use  . Smoking status: Former Smoker    Years: 1.00  . Smokeless tobacco: Never Used  Vaping Use  . Vaping Use: Never used  Substance and Sexual Activity  . Alcohol use: Yes    Comment: wine holidays  . Drug use: No  . Sexual activity: Never  Other Topics Concern  . Not on file  Social History Narrative  . Not on file   Social Determinants of Health   Financial Resource Strain:  Not on file  Food Insecurity: Not on file  Transportation Needs: Not on file  Physical Activity: Not on file  Stress: Not on file  Social Connections: Not on file  Intimate Partner Violence: Not on file    Outpatient Medications Prior to Visit  Medication Sig Dispense Refill  . diphenhydrAMINE (BENYLIN) 12.5 MG/5ML syrup Take 2.5 mLs (6.25 mg total) by mouth 4 (four) times daily as needed for allergies. 120 mL 0  . fexofenadine (ALLEGRA) 180 MG tablet Take 180 mg by mouth daily as needed for allergies or rhinitis.    . Multiple Vitamins-Minerals (CENTRUM ADULTS) TABS Take 1 tablet by mouth daily.    . naproxen sodium (ALEVE) 220 MG tablet Take 220 mg by mouth daily as needed (pain).    . NON FORMULARY Place 1 tablet under the tongue daily. Boiron    . Phenyleph-Doxylamine-DM-APAP (NYQUIL SEVERE COLD/FLU)  5-6.25-10-325 MG/15ML LIQD Take 15 mLs by mouth every 6 (six) hours as needed (cough).    Marland Kitchen PREVIDENT 0.2 % SOLN Take 5 mLs by mouth as directed. Swish and spit    . Pseudoeph-Doxylamine-DM-APAP (DAYQUIL/NYQUIL COLD/FLU RELIEF PO) Take 2 tablets by mouth every 6 (six) hours as needed (cold symptoms).    Marland Kitchen albuterol (VENTOLIN HFA) 108 (90 Base) MCG/ACT inhaler Inhale 2 puffs into the lungs every 6 (six) hours as needed for wheezing or shortness of breath. Dispense with aerochamber 1 each 0  . Vitamin D, Ergocalciferol, (DRISDOL) 1.25 MG (50000 UNIT) CAPS capsule Take 1 capsule (50,000 Units total) by mouth every 7 (seven) days. (Patient not taking: Reported on 01/27/2021) 5 capsule 3  . azithromycin (ZITHROMAX Z-PAK) 250 MG tablet Take 1 tablet (250 mg total) by mouth daily. Z-pack: take two pills today. Take one pill per day for the next 4 days. (Patient not taking: No sig reported) 6 tablet 0  . predniSONE (STERAPRED UNI-PAK 21 TAB) 10 MG (21) TBPK tablet Take by mouth daily. Take 6 tabs by mouth daily  for 2 days, then 5 tabs for 2 days, then 4 tabs for 2 days, then 3 tabs for 2 days, 2 tabs for 2 days, then 1 tab by mouth daily for 2 days (Patient not taking: No sig reported) 42 tablet 0  . promethazine-dextromethorphan (PROMETHAZINE-DM) 6.25-15 MG/5ML syrup Take 5 mLs by mouth 4 (four) times daily as needed for cough. (Patient not taking: No sig reported) 118 mL 0   No facility-administered medications prior to visit.    Allergies  Allergen Reactions  . Elemental Sulfur Itching and Swelling  . Sulfa Antibiotics     ROS Review of Systems    Objective:    Physical Exam Constitutional:      Appearance: She is normal weight.  HENT:     Head: Normocephalic and atraumatic.     Right Ear: Tympanic membrane normal.     Left Ear: Tympanic membrane normal.     Nose: Nose normal.     Mouth/Throat:     Mouth: Mucous membranes are moist.  Cardiovascular:     Rate and Rhythm: Normal rate and  regular rhythm.     Pulses: Normal pulses.     Heart sounds: Normal heart sounds.  Pulmonary:     Effort: Pulmonary effort is normal.     Breath sounds: Normal breath sounds.  Abdominal:     General: Bowel sounds are normal.     Palpations: Abdomen is soft.  Musculoskeletal:        General: Normal range  of motion.     Cervical back: Normal range of motion.  Skin:    General: Skin is warm and dry.     Capillary Refill: Capillary refill takes less than 2 seconds.  Neurological:     General: No focal deficit present.     Mental Status: She is alert and oriented to person, place, and time.  Psychiatric:        Mood and Affect: Mood normal.        Behavior: Behavior normal.     BP 122/66 (BP Location: Right Arm, Patient Position: Sitting, Cuff Size: Small)   Pulse 69   Temp (!) 97 F (36.1 C)   Ht 5' 6.5" (1.689 m)   Wt 129 lb (58.5 kg)   SpO2 99%   BMI 20.51 kg/m  Wt Readings from Last 3 Encounters:  01/27/21 129 lb (58.5 kg)  01/25/21 135 lb (61.2 kg)  10/05/20 130 lb 3.2 oz (59.1 kg)     There are no preventive care reminders to display for this patient.  There are no preventive care reminders to display for this patient.  Lab Results  Component Value Date   TSH 0.962 10/05/2020   Lab Results  Component Value Date   WBC 4.6 01/27/2021   HGB 13.4 01/27/2021   HCT 40.2 01/27/2021   MCV 94 01/27/2021   PLT 193 01/27/2021   Lab Results  Component Value Date   NA 145 (H) 01/27/2021   K 4.4 01/27/2021   CO2 25 01/25/2021   GLUCOSE 93 01/27/2021   BUN 8 01/27/2021   CREATININE 0.67 01/27/2021   BILITOT 0.2 01/27/2021   ALKPHOS 88 01/27/2021   AST 32 01/27/2021   ALT 18 10/05/2020   PROT 6.7 01/27/2021   ALBUMIN 4.4 01/27/2021   CALCIUM 9.1 01/27/2021   ANIONGAP 8 01/25/2021   EGFR 95 01/27/2021   Lab Results  Component Value Date   CHOL 245 (H) 01/27/2021   Lab Results  Component Value Date   HDL 66 01/27/2021   Lab Results  Component Value  Date   LDLCALC 144 (H) 01/27/2021   Lab Results  Component Value Date   TRIG 196 (H) 01/27/2021   Lab Results  Component Value Date   CHOLHDL 3.7 01/27/2021   Lab Results  Component Value Date   HGBA1C 5.2 10/11/2016      Assessment & Plan:   Problem List Items Addressed This Visit      Other   Hypercholesterolemia Due to elevated HDL Referral to cardiology for further evaluation of CP    Relevant Orders   Lipid panel (Completed)    Other Visit Diagnoses    Chest pain, unspecified type    -  Primary Referral for further evaluation to extensive family history of heart disease   Relevant Orders   Ambulatory referral to Cardiology   Family history of heart disease       Family history of heart attack       Epigastric pain     Persistent  Encourage patient to call GI for follow up due to her history of abnormal colonoscopy and persistent diarrhea   Relevant Orders   Comp. Metabolic Panel (12) (Completed)   H. pylori breath test (Completed)   Diarrhea, unspecified type     GI follow with stool cultures pending   Relevant Orders   CBC with Differential/Platelet (Completed)   Cdiff NAA+O+P+Stool Culture      Meds ordered this encounter  Medications  .  benzonatate (TESSALON) 100 MG capsule    Sig: Take 1 capsule (100 mg total) by mouth 3 (three) times daily as needed for up to 10 days for cough. Never suck or chew on a benzonatate capsule.    Dispense:  30 capsule    Refill:  0    Do not place medication on "Automatic Refill". Please provide patient with medication counseling and recommendations.    Order Specific Question:   Supervising Provider    Answer:   Tresa Garter [[id number]]    Follow-up: Return in about 3 months (around 04/29/2021).    Vevelyn Francois, NP

## 2021-01-27 NOTE — Patient Instructions (Signed)
Nonspecific Chest Pain Chest pain can be caused by many different conditions. Some causes of chest pain can be life-threatening. These will require treatment right away. Serious causes of chest pain include:  Heart attack.  A tear in the body's main blood vessel.  Redness and swelling (inflammation) around your heart.  Blood clot in your lungs. Other causes of chest pain may not be so serious. These include:  Heartburn.  Anxiety or stress.  Damage to bones or muscles in your chest.  Lung infections. Chest pain can feel like:  Pain or discomfort in your chest.  Crushing, pressure, aching, or squeezing pain.  Burning or tingling.  Dull or sharp pain that is worse when you move, cough, or take a deep breath.  Pain or discomfort that is also felt in your back, neck, jaw, shoulder, or arm, or pain that spreads to any of these areas. It is hard to know whether your pain is caused by something that is serious or something that is not so serious. So it is important to see your doctor right away if you have chest pain. Follow these instructions at home: Medicines  Take over-the-counter and prescription medicines only as told by your doctor.  If you were prescribed an antibiotic medicine, take it as told by your doctor. Do not stop taking the antibiotic even if you start to feel better. Lifestyle  Rest as told by your doctor.  Do not use any products that contain nicotine or tobacco, such as cigarettes, e-cigarettes, and chewing tobacco. If you need help quitting, ask your doctor.  Do not drink alcohol.  Make lifestyle changes as told by your doctor. These may include: ? Getting regular exercise. Ask your doctor what activities are safe for you. ? Eating a heart-healthy diet. A diet and nutrition specialist (dietitian) can help you to learn healthy eating options. ? Staying at a healthy weight. ? Treating diabetes or high blood pressure, if needed. ? Lowering your stress.  Activities such as yoga and relaxation techniques can help.   General instructions  Pay attention to any changes in your symptoms. Tell your doctor about them or any new symptoms.  Avoid any activities that cause chest pain.  Keep all follow-up visits as told by your doctor. This is important. You may need more testing if your chest pain does not go away. Contact a doctor if:  Your chest pain does not go away.  You feel depressed.  You have a fever. Get help right away if:  Your chest pain is worse.  You have a cough that gets worse, or you cough up blood.  You have very bad (severe) pain in your belly (abdomen).  You pass out (faint).  You have either of these for no clear reason: ? Sudden chest discomfort. ? Sudden discomfort in your arms, back, neck, or jaw.  You have shortness of breath at any time.  You suddenly start to sweat, or your skin gets clammy.  You feel sick to your stomach (nauseous).  You throw up (vomit).  You suddenly feel lightheaded or dizzy.  You feel very weak or tired.  Your heart starts to beat fast, or it feels like it is skipping beats. These symptoms may be an emergency. Do not wait to see if the symptoms will go away. Get medical help right away. Call your local emergency services (911 in the [place] Do not drive yourself to the hospital. Summary  Chest pain can be caused by many different conditions.  The cause may be serious and need treatment right away. If you have chest pain, see your doctor right away.  Follow your doctor's instructions for taking medicines and making lifestyle changes.  Keep all follow-up visits as told by your doctor. This includes visits for any further testing if your chest pain does not go away.  Be sure to know the signs that show that your condition has become worse. Get help right away if you have these symptoms. This information is not intended to replace advice given to you by your health care provider.  Make sure you discuss any questions you have with your health care provider. Document Revised: 02/28/2018 Document Reviewed: 02/28/2018 Elsevier Patient Education  2021 [place] Following a healthy eating pattern may help you to achieve and maintain a healthy body weight, reduce the risk of chronic disease, and live a long and productive life. It is important to follow a healthy eating pattern at an appropriate calorie level for your body. Your nutritional needs should be met primarily through food by choosing a variety of nutrient-rich foods. What are tips for following this plan? Reading food labels  Read labels and choose the following: ? Reduced or low sodium. ? Juices with 100% fruit juice. ? Foods with low saturated fats and high polyunsaturated and monounsaturated fats. ? Foods with whole grains, such as whole wheat, cracked wheat, brown rice, and wild rice. ? Whole grains that are fortified with folic acid. This is recommended for women who are pregnant or who want to become pregnant.  Read labels and avoid the following: ? Foods with a lot of added sugars. These include foods that contain brown sugar, corn sweetener, corn syrup, dextrose, fructose, glucose, high-fructose corn syrup, honey, invert sugar, lactose, malt syrup, maltose, molasses, raw sugar, sucrose, trehalose, or turbinado sugar.  Do not eat more than the following amounts of added sugar per day:  6 teaspoons (25 g) for women.  9 teaspoons (38 g) for men. ? Foods that contain processed or refined starches and grains. ? Refined grain products, such as white flour, degermed cornmeal, white bread, and white rice. Shopping  Choose nutrient-rich snacks, such as vegetables, whole fruits, and nuts. Avoid high-calorie and high-sugar snacks, such as potato chips, fruit snacks, and candy.  Use oil-based dressings and spreads on foods instead of solid fats such as butter, stick margarine, or cream  cheese.  Limit pre-made sauces, mixes, and "instant" products such as flavored rice, instant noodles, and ready-made pasta.  Try more plant-protein sources, such as tofu, tempeh, black beans, edamame, lentils, nuts, and seeds.  Explore eating plans such as the Mediterranean diet or vegetarian diet. Cooking  Use oil to saut or stir-fry foods instead of solid fats such as butter, stick margarine, or lard.  Try baking, boiling, grilling, or broiling instead of frying.  Remove the fatty part of meats before cooking.  Steam vegetables in water or broth. Meal planning  At meals, imagine dividing your plate into fourths: ? One-half of your plate is fruits and vegetables. ? One-fourth of your plate is whole grains. ? One-fourth of your plate is protein, especially lean meats, poultry, eggs, tofu, beans, or nuts.  Include low-fat dairy as part of your daily diet.   Lifestyle  Choose healthy options in all settings, including home, work, school, restaurants, or stores.  Prepare your food safely: ? Wash your hands after handling raw meats. ? Keep food preparation surfaces clean by regularly washing with hot, soapy  water. ? Keep raw meats separate from ready-to-eat foods, such as fruits and vegetables. ? Cook seafood, meat, poultry, and eggs to the recommended internal temperature. ? Store foods at safe temperatures. In general:  Keep cold foods at 46F (4.4C) or below.  Keep hot foods at 146F (60C) or above.  Keep your freezer at [facility] (-17.8C) or below.  Foods are no longer safe to eat when they have been between the temperatures of 40-146F (4.4-60C) for more than 2 hours. What foods should I eat? Fruits Aim to eat 2 cup-equivalents of fresh, canned (in natural juice), or frozen fruits each day. Examples of 1 cup-equivalent of fruit include 1 small apple, 8 large strawberries, 1 cup canned fruit,  cup dried fruit, or 1 cup 100% juice. Vegetables Aim to eat 2-3  cup-equivalents of fresh and frozen vegetables each day, including different varieties and colors. Examples of 1 cup-equivalent of vegetables include 2 medium carrots, 2 cups raw, leafy greens, 1 cup chopped vegetable (raw or cooked), or 1 medium baked potato. Grains Aim to eat 6 ounce-equivalents of whole grains each day. Examples of 1 ounce-equivalent of grains include 1 slice of bread, 1 cup ready-to-eat cereal, 3 cups popcorn, or  cup cooked rice, pasta, or cereal. Meats and other proteins Aim to eat 5-6 ounce-equivalents of protein each day. Examples of 1 ounce-equivalent of protein include 1 egg, 1/2 cup nuts or seeds, or 1 tablespoon (16 g) peanut butter. A cut of meat or fish that is the size of a deck of cards is about 3-4 ounce-equivalents.  Of the protein you eat each week, try to have at least 8 ounces come from seafood. This includes salmon, trout, herring, and anchovies. Dairy Aim to eat 3 cup-equivalents of fat-free or low-fat dairy each day. Examples of 1 cup-equivalent of dairy include 1 cup (240 mL) milk, 8 ounces (250 g) yogurt, 1 ounces (44 g) natural cheese, or 1 cup (240 mL) fortified soy milk. Fats and oils  Aim for about 5 teaspoons (21 g) per day. Choose monounsaturated fats, such as canola and olive oils, avocados, peanut butter, and most nuts, or polyunsaturated fats, such as sunflower, corn, and soybean oils, walnuts, pine nuts, sesame seeds, sunflower seeds, and flaxseed. Beverages  Aim for six 8-oz glasses of water per day. Limit coffee to three to five 8-oz cups per day.  Limit caffeinated beverages that have added calories, such as soda and energy drinks.  Limit alcohol intake to no more than 1 drink a day for nonpregnant women and 2 drinks a day for men. One drink equals 12 oz of beer (355 mL), 5 oz of wine (148 mL), or 1 oz of hard liquor (44 mL). Seasoning and other foods  Avoid adding excess amounts of salt to your foods. Try flavoring foods with herbs and  spices instead of salt.  Avoid adding sugar to foods.  Try using oil-based dressings, sauces, and spreads instead of solid fats. This information is based on general [place] nutrition guidelines. For more information, visit [employer]. Exact amounts may vary based on your nutrition needs. Summary  A healthy eating plan may help you to maintain a healthy weight, reduce the risk of chronic diseases, and stay active throughout your life.  Plan your meals. Make sure you eat the right portions of a variety of nutrient-rich foods.  Try baking, boiling, grilling, or broiling instead of frying.  Choose healthy options in all settings, including home, work, school, restaurants, or stores. This information is  not intended to replace advice given to you by your health care provider. Make sure you discuss any questions you have with your health care provider. Document Revised: 12/10/2017 Document Reviewed: 12/10/2017 Elsevier Patient Education  [place].

## 2021-01-28 LAB — CBC WITH DIFFERENTIAL/PLATELET
Basophils Absolute: 0 10*3/uL (ref 0.0–0.2)
Basos: 1 %
EOS (ABSOLUTE): 0 10*3/uL (ref 0.0–0.4)
Eos: 1 %
Hematocrit: 40.2 % (ref 34.0–46.6)
Hemoglobin: 13.4 g/dL (ref 11.1–15.9)
Immature Grans (Abs): 0 10*3/uL (ref 0.0–0.1)
Immature Granulocytes: 0 %
Lymphocytes Absolute: 1.8 10*3/uL (ref 0.7–3.1)
Lymphs: 38 %
MCH: 31.2 pg (ref 26.6–33.0)
MCHC: 33.3 g/dL (ref 31.5–35.7)
MCV: 94 fL (ref 79–97)
Monocytes Absolute: 0.3 10*3/uL (ref 0.1–0.9)
Monocytes: 7 %
Neutrophils Absolute: 2.5 10*3/uL (ref 1.4–7.0)
Neutrophils: 53 %
Platelets: 193 10*3/uL (ref 150–450)
RBC: 4.3 x10E6/uL (ref 3.77–5.28)
RDW: 12.9 % (ref 11.7–15.4)
WBC: 4.6 10*3/uL (ref 3.4–10.8)

## 2021-01-28 LAB — LIPID PANEL
Chol/HDL Ratio: 3.7 ratio (ref 0.0–4.4)
Cholesterol, Total: 245 mg/dL — ABNORMAL HIGH (ref 100–199)
HDL: 66 mg/dL (ref >39)
LDL Chol Calc (NIH): 144 mg/dL — ABNORMAL HIGH (ref 0–99)
Triglycerides: 196 mg/dL — ABNORMAL HIGH (ref 0–149)
VLDL Cholesterol Cal: 35 mg/dL (ref 5–40)

## 2021-01-28 LAB — COMP. METABOLIC PANEL (12)
AST: 32 IU/L (ref 0–40)
Albumin/Globulin Ratio: 1.9 (ref 1.2–2.2)
Albumin: 4.4 g/dL (ref 3.8–4.8)
Alkaline Phosphatase: 88 IU/L (ref 44–121)
BUN/Creatinine Ratio: 12 (ref 12–28)
BUN: 8 mg/dL (ref 8–27)
Bilirubin Total: 0.2 mg/dL (ref 0.0–1.2)
Calcium: 9.1 mg/dL (ref 8.7–10.3)
Chloride: 104 mmol/L (ref 96–106)
Creatinine, Ser: 0.67 mg/dL (ref 0.57–1.00)
Globulin, Total: 2.3 g/dL (ref 1.5–4.5)
Glucose: 93 mg/dL (ref 65–99)
Potassium: 4.4 mmol/L (ref 3.5–5.2)
Sodium: 145 mmol/L — ABNORMAL HIGH (ref 134–144)
Total Protein: 6.7 g/dL (ref 6.0–8.5)
eGFR: 95 mL/min/{1.73_m2} (ref >59)

## 2021-01-29 LAB — H. PYLORI BREATH TEST: H pylori Breath Test: NEGATIVE

## 2021-01-30 ENCOUNTER — Encounter: Payer: Self-pay | Admitting: Nurse Practitioner

## 2021-02-17 ENCOUNTER — Ambulatory Visit: Payer: Self-pay | Admitting: Gastroenterology

## 2021-06-18 ENCOUNTER — Ambulatory Visit (HOSPITAL_COMMUNITY)
Admission: EM | Admit: 2021-06-18 | Discharge: 2021-06-18 | Disposition: A | Discharge status: Home / Self Care | Payer: Medicare (Managed Care) | Attending: Physician Assistant | Admitting: Physician Assistant

## 2021-06-18 ENCOUNTER — Encounter (HOSPITAL_COMMUNITY): Payer: Self-pay | Admitting: Emergency Medicine

## 2021-06-18 ENCOUNTER — Other Ambulatory Visit: Payer: Self-pay

## 2021-06-18 DIAGNOSIS — S60361A Insect bite (nonvenomous) of right thumb, initial encounter: Secondary | ICD-10-CM

## 2021-06-18 DIAGNOSIS — W57XXXA Bitten or stung by nonvenomous insect and other nonvenomous arthropods, initial encounter: Secondary | ICD-10-CM

## 2021-06-18 DIAGNOSIS — L03011 Cellulitis of right finger: Secondary | ICD-10-CM | POA: Diagnosis not present

## 2021-06-18 DIAGNOSIS — M7989 Other specified soft tissue disorders: Secondary | ICD-10-CM | POA: Diagnosis not present

## 2021-06-18 DIAGNOSIS — R030 Elevated blood-pressure reading, without diagnosis of hypertension: Secondary | ICD-10-CM

## 2021-06-18 MED ORDER — PREDNISONE 10 MG PO TABS: 20.0000 mg | ORAL_TABLET | Freq: Every day | ORAL | 0 refills | Status: AC

## 2021-06-18 MED ORDER — CEPHALEXIN 500 MG PO CAPS: 500.0000 mg | ORAL_CAPSULE | Freq: Three times a day (TID) | ORAL | 0 refills | Status: DC

## 2021-06-18 NOTE — ED Provider Notes (Signed)
[place]    CSN: [id number] Arrival date & time: 06/18/21  1439      History   Chief Complaint Chief Complaint  Patient presents with   Insect Bite    HPI Deanna Knight is a 70 y.o. female.   Patient presents today with increased swelling and pain of her right thumb following insect bite.  Reports that she was moving a plant when she was bitten by several ants.  She has been using Benadryl and Naprosyn to manage symptoms without improvement of symptoms.  She reports continued swelling of her right thumb with associated pain.  Pain is rated 5 on a 0-10 pain scale, described as throbbing/aching, no aggravating relieving factors identified.  She reports pain is traveling up her arm towards her axilla.  She denies any fever, abdominal pain, nausea, vomiting, body aches, headache, dizziness.  She denies any recent antibiotic use.  She is concerned that pain has been increasing over the past several days despite conservative treatment.  Patient blood pressure was noted to be elevated today.  She denies any chest pain, shortness of breath, headache, dizziness, vision changes.  She does not have a history of hypertension does not take antihypertensive medication.  She can monitor her blood pressure at home.  Does report increased salt intake.  Denies any recent decongestant use or additional medication changes.   Past Medical History:  Diagnosis Date   Allergy    seasonal   Hypercholesteremia    Pneumothorax    Sarcoidosis    Vitamin D deficiency 09/2020    Patient Active Problem List   Diagnosis Date Noted   Pap test, as part of routine gynecological examination 01/09/2017   Hypercholesterolemia 10/11/2016   Hyperglycemia 10/11/2016   Blurred vision, left eye 10/11/2016    Past Surgical History:  Procedure Laterality Date   LUNG SURGERY     denies surgery 03/19/19   TUBAL LIGATION      OB History   No obstetric history on file.      Home Medications     Prior to Admission medications   Medication Sig Start Date End Date Taking? Authorizing Provider  cephALEXin (KEFLEX) 500 MG capsule Take 1 capsule (500 mg total) by mouth 3 (three) times daily. 06/18/21  Yes Zaide Mcclenahan K, PA-C  predniSONE (DELTASONE) 10 MG tablet Take 2 tablets (20 mg total) by mouth daily for 4 days. 06/18/21 06/22/21 Yes Hayle Parisi K, PA-C  albuterol (VENTOLIN HFA) 108 (90 Base) MCG/ACT inhaler Inhale 2 puffs into the lungs every 6 (six) hours as needed for wheezing or shortness of breath. Dispense with aerochamber 10/21/20   Dorena Dew, FNP  diphenhydrAMINE (BENYLIN) 12.5 MG/5ML syrup Take 2.5 mLs (6.25 mg total) by mouth 4 (four) times daily as needed for allergies. 11/12/17   Dorena Dew, FNP  fexofenadine (ALLEGRA) 180 MG tablet Take 180 mg by mouth daily as needed for allergies or rhinitis.    [provider]  Multiple Vitamins-Minerals (CENTRUM ADULTS) TABS Take 1 tablet by mouth daily.    [provider]  naproxen sodium (ALEVE) 220 MG tablet Take 220 mg by mouth daily as needed (pain).    [provider]  NON FORMULARY Place 1 tablet under the tongue daily. Boiron    [provider]  Phenyleph-Doxylamine-DM-APAP (NYQUIL SEVERE COLD/FLU) 5-6.25-10-325 MG/15ML LIQD Take 15 mLs by mouth every 6 (six) hours as needed (cough).    [provider]  PREVIDENT 0.2 % SOLN Take 5  mLs by mouth as directed. Swish and spit 10/21/20   [provider]  Pseudoeph-Doxylamine-DM-APAP (DAYQUIL/NYQUIL COLD/FLU RELIEF PO) Take 2 tablets by mouth every 6 (six) hours as needed (cold symptoms).    [provider]  Vitamin D, Ergocalciferol, (DRISDOL) 1.25 MG (50000 UNIT) CAPS capsule Take 1 capsule (50,000 Units total) by mouth every 7 (seven) days. Patient not taking: Reported on 01/27/2021 10/06/20   Azzie Glatter, FNP    Family History Family History  Problem Relation Age of Onset   Stroke Mother    Heart  disease Mother    Heart disease Father    Stroke Sister    Heart disease Sister    Leukemia Sister    Stroke Brother    Heart disease Brother    Lung cancer Paternal Aunt    Colon cancer Neg Hx    Colon polyps Neg Hx    Esophageal cancer Neg Hx    Rectal cancer Neg Hx    Stomach cancer Neg Hx     Social History Social History   Tobacco Use   Smoking status: Former    Years: 1.00    Types: Cigarettes   Smokeless tobacco: Never  Vaping Use   Vaping Use: Never used  Substance Use Topics   Alcohol use: Yes    Comment: wine holidays   Drug use: No     Allergies   Elemental sulfur and Sulfa antibiotics   Review of Systems Review of Systems  Constitutional:  Negative for activity change, appetite change, fatigue and fever.  Eyes:  Negative for photophobia and visual disturbance.  Respiratory:  Negative for cough and shortness of breath.   Cardiovascular:  Negative for chest pain.  Gastrointestinal:  Negative for abdominal pain, diarrhea, nausea and vomiting.  Musculoskeletal:  Negative for arthralgias and myalgias.  Skin:  Positive for color change and wound.  Neurological:  Negative for dizziness, light-headedness and headaches.    Physical Exam Triage Vital Signs ED Triage Vitals  Enc Vitals Group     BP 06/18/21 1554 (!) 160/99     Pulse Rate 06/18/21 1554 65     Resp 06/18/21 1554 17     Temp 06/18/21 1554 98.5 F (36.9 C)     Temp src --      SpO2 06/18/21 1554 98 %     Weight --      Height --      Head Circumference --      Peak Flow --      Pain Score 06/18/21 1552 6     Pain Loc --      Pain Edu? --      Excl. in [place]? --    No data found.  Updated Vital Signs BP 129/76   Pulse 65   Temp 98.5 F (36.9 C)   Resp 17   SpO2 98%   Visual Acuity Right Eye Distance:   Left Eye Distance:   Bilateral Distance:    Right Eye Near:   Left Eye Near:    Bilateral Near:     Physical Exam Vitals reviewed.  Constitutional:      General: She is  awake. She is not in acute distress.    Appearance: Normal appearance. She is well-developed. She is not ill-appearing.     Comments: Very pleasant female appears stated age no acute distress  HENT:     Head: Normocephalic and atraumatic.  Cardiovascular:     Rate and Rhythm: Normal rate  and regular rhythm.     Pulses:          Radial pulses are 2+ on the right side and 2+ on the left side.     Heart sounds: Normal heart sounds, S1 normal and S2 normal. No murmur heard. Pulmonary:     Effort: Pulmonary effort is normal.     Breath sounds: Normal breath sounds. No wheezing, rhonchi or rales.     Comments: Clear to auscultation bilaterally Abdominal:     Palpations: Abdomen is soft.     Tenderness: There is no abdominal tenderness.  Musculoskeletal:     Right lower leg: No edema.     Left lower leg: No edema.  Skin:    Findings: Erythema and rash present. Rash is pustular and vesicular.     Comments: Erythema and swelling noted of right thumb with several small pustules and fine vesicles noted.  No streaking or evidence of lymphangitis.  No bleeding or drainage noted.  Psychiatric:        Behavior: Behavior is cooperative.     UC Treatments / Results  Labs (all labs ordered are listed, but only abnormal results are displayed) Labs Reviewed - No data to display  EKG   Radiology No results found.  Procedures Procedures (including critical care time)  Medications Ordered in UC Medications - No data to display  Initial Impression / Assessment and Plan / UC Course  I have reviewed the triage vital signs and the nursing notes.  Pertinent labs & imaging results that were available during my care of the patient were reviewed by me and considered in my medical decision making (see chart for details).      Will cover for infection given her worsening pain and swelling with pustules noted on exam.  Patient was started on Keflex 500 mg 3 times a day.  No indication for dose  adjustment based on CMP from 01/27/2021 with creatinine of 0.67 and calculated creatinine clearance of 72.17 mL/min.  She was encouraged to keep area clean.  Recommended she use over-the-counter medications such as Tylenol for pain.  Discussed that symptoms could have an allergic component so will prescribe prednisone since she has failed antihistamine therapy.  She was instructed not to take NSAIDs with this medication due to risk of GI bleeding.  Recommended she use compression, ice, elevation for additional symptom relief.  Discussed alarm symptoms that warrant emergent evaluation.  Strict return precautions given to which she expressed understanding.  Suspect elevated blood pressure situational related to pain discomfort.  Patient denies any signs/symptoms of endorgan damage.  She was instructed to avoid decongestants, NSAIDs, caffeine, salt.  Recommended she monitor blood pressure at home and if this remains above 140/90 she needs to be reevaluated.  Discussed that if she develops any chest pain, shortness of breath, vision changes, headache, dizziness in the setting of high blood pressure she should go to the emergency room.  She should follow-up with primary care provider within a week.  Final Clinical Impressions(s) / UC Diagnoses   Final diagnoses:  Insect bite of right thumb, initial encounter  Cellulitis of finger of right hand  Swelling of thumb, right  Elevated blood pressure reading     Discharge Instructions      Take antibiotic 3 times a day as discussed to cover for infection.  We are also calling in a low-dose of prednisone to help with swelling.  Take 20 mg for 4 days.  Do not take NSAIDs including  aspirin, ibuprofen/Advil, naproxen/Aleve with this medication due to risk of GI bleeding.  You can use Tylenol as needed.  Keep area elevated and use ice.  If you have any worsening symptoms please return for reevaluation.  Your blood pressure was elevated.  Monitor this at home.  If  this is persistently above 140/90 you need to be reevaluated.  If you develop any chest pain, shortness of breath, leg swelling, vision changes, dizziness in the setting of high blood pressure you need to go to the emergency room.  Avoid decongestants, salt, caffeine.  Follow-up with your primary care provider within a week.     ED Prescriptions     Medication Sig Dispense Auth. Provider   predniSONE (DELTASONE) 10 MG tablet Take 2 tablets (20 mg total) by mouth daily for 4 days. 8 tablet Carey Lafon K, PA-C   cephALEXin (KEFLEX) 500 MG capsule Take 1 capsule (500 mg total) by mouth 3 (three) times daily. 21 capsule Sheila Gervasi K, PA-C      PDMP not reviewed this encounter.   Terrilee Croak, PA-C 06/18/21 1624

## 2021-06-18 NOTE — Discharge Instructions (Addendum)
Take antibiotic 3 times a day as discussed to cover for infection.  We are also calling in a low-dose of prednisone to help with swelling.  Take 20 mg for 4 days.  Do not take NSAIDs including aspirin, ibuprofen/Advil, naproxen/Aleve with this medication due to risk of GI bleeding.  You can use Tylenol as needed.  Keep area elevated and use ice.  If you have any worsening symptoms please return for reevaluation.  Your blood pressure was elevated.  Monitor this at home.  If this is persistently above 140/90 you need to be reevaluated.  If you develop any chest pain, shortness of breath, leg swelling, vision changes, dizziness in the setting of high blood pressure you need to go to the emergency room.  Avoid decongestants, salt, caffeine.  Follow-up with your primary care provider within a week.

## 2021-06-18 NOTE — ED Triage Notes (Signed)
Pt is present today with a insect bite on her right thumb. Pt has visible swelling on her thumb. Pt sates that the pain is radiating up her right arm. Pt states that she was bitten Thursday

## 2022-03-20 ENCOUNTER — Other Ambulatory Visit: Payer: Self-pay

## 2022-03-20 ENCOUNTER — Ambulatory Visit (HOSPITAL_COMMUNITY)
Admission: EM | Admit: 2022-03-20 | Discharge: 2022-03-20 | Disposition: A | Discharge status: Home / Self Care | Payer: Medicare (Managed Care) | Attending: Family Medicine | Admitting: Family Medicine

## 2022-03-20 ENCOUNTER — Ambulatory Visit (INDEPENDENT_AMBULATORY_CARE_PROVIDER_SITE_OTHER): Payer: Medicare (Managed Care)

## 2022-03-20 ENCOUNTER — Encounter (HOSPITAL_COMMUNITY): Payer: Self-pay | Admitting: *Deleted

## 2022-03-20 DIAGNOSIS — S92514A Nondisplaced fracture of proximal phalanx of right lesser toe(s), initial encounter for closed fracture: Secondary | ICD-10-CM | POA: Diagnosis not present

## 2022-03-20 MED ORDER — TRAMADOL HCL 50 MG PO TABS: 50.0000 mg | ORAL_TABLET | Freq: Four times a day (QID) | ORAL | 0 refills | Status: DC | PRN

## 2022-03-20 NOTE — ED Provider Notes (Signed)
[place]    CSN: [id number] Arrival date & time: 03/20/22  0931      History   Chief Complaint Chief Complaint  Patient presents with   Toe Pain    HPI Deanna Knight is a 71 y.o. female.    Toe Pain   Here for right pinky toe pain  Yesterday she was rushing into the house to get in and off her that was biting her.  She hit some furniture with her right pinky toe and it swelled up and has been hurting since.  She did take 2 Aleve which helped the pain  Past Medical History:  Diagnosis Date   Allergy    seasonal   Hypercholesteremia    Pneumothorax    Sarcoidosis    Vitamin D deficiency 09/2020    Patient Active Problem List   Diagnosis Date Noted   Pap test, as part of routine gynecological examination 01/09/2017   Hypercholesterolemia 10/11/2016   Hyperglycemia 10/11/2016   Blurred vision, left eye 10/11/2016    Past Surgical History:  Procedure Laterality Date   LUNG SURGERY     denies surgery 03/19/19   TUBAL LIGATION      OB History   No obstetric history on file.      Home Medications    Prior to Admission medications   Medication Sig Start Date End Date Taking? Authorizing Provider  traMADol (ULTRAM) 50 MG tablet Take 1 tablet (50 mg total) by mouth every 6 (six) hours as needed. 03/20/22  Yes Tupac Jeffus, Gwenlyn Perking, MD  albuterol (VENTOLIN HFA) 108 (90 Base) MCG/ACT inhaler Inhale 2 puffs into the lungs every 6 (six) hours as needed for wheezing or shortness of breath. Dispense with aerochamber 10/21/20   Dorena Dew, FNP  cephALEXin (KEFLEX) 500 MG capsule Take 1 capsule (500 mg total) by mouth 3 (three) times daily. 06/18/21   Raspet, Derry Skill, PA-C  diphenhydrAMINE (BENYLIN) 12.5 MG/5ML syrup Take 2.5 mLs (6.25 mg total) by mouth 4 (four) times daily as needed for allergies. 11/12/17   Dorena Dew, FNP  fexofenadine (ALLEGRA) 180 MG tablet Take 180 mg by mouth daily as needed for allergies or rhinitis.    [provider]  Multiple Vitamins-Minerals (CENTRUM ADULTS) TABS Take 1 tablet by mouth daily.    [provider]  naproxen sodium (ALEVE) 220 MG tablet Take 220 mg by mouth daily as needed (pain).    [provider]  NON FORMULARY Place 1 tablet under the tongue daily. Boiron    [provider]  Phenyleph-Doxylamine-DM-APAP (NYQUIL SEVERE COLD/FLU) 5-6.25-10-325 MG/15ML LIQD Take 15 mLs by mouth every 6 (six) hours as needed (cough).    [provider]  PREVIDENT 0.2 % SOLN Take 5 mLs by mouth as directed. Swish and spit 10/21/20   [provider]  Pseudoeph-Doxylamine-DM-APAP (DAYQUIL/NYQUIL COLD/FLU RELIEF PO) Take 2 tablets by mouth every 6 (six) hours as needed (cold symptoms).    [provider]  Vitamin D, Ergocalciferol, (DRISDOL) 1.25 MG (50000 UNIT) CAPS capsule Take 1 capsule (50,000 Units total) by mouth every 7 (seven) days. Patient not taking: Reported on 01/27/2021 10/06/20   Azzie Glatter, FNP    Family History Family History  Problem Relation Age of Onset   Stroke Mother    Heart disease Mother    Heart disease Father    Stroke Sister    Heart disease Sister    Leukemia Sister    Stroke Brother  Heart disease Brother    Lung cancer Paternal Aunt    Colon cancer Neg Hx    Colon polyps Neg Hx    Esophageal cancer Neg Hx    Rectal cancer Neg Hx    Stomach cancer Neg Hx     Social History Social History   Tobacco Use   Smoking status: Former    Years: 1.00    Types: Cigarettes   Smokeless tobacco: Never  Vaping Use   Vaping Use: Never used  Substance Use Topics   Alcohol use: Yes    Comment: wine holidays   Drug use: No     Allergies   Elemental sulfur and Sulfa antibiotics   Review of Systems Review of Systems   Physical Exam Triage Vital Signs ED Triage Vitals  Enc Vitals Group     BP 03/20/22 1005 131/69     Pulse Rate 03/20/22 1005 76     Resp 03/20/22 1005 18     Temp 03/20/22 1005  98.7 F (37.1 C)     Temp src --      SpO2 03/20/22 1005 100 %     Weight --      Height --      Head Circumference --      Peak Flow --      Pain Score 03/20/22 1003 9     Pain Loc --      Pain Edu? --      Excl. in [place]? --    No data found.  Updated Vital Signs BP 131/69   Pulse 76   Temp 98.7 F (37.1 C)   Resp 18   SpO2 100%   Visual Acuity Right Eye Distance:   Left Eye Distance:   Bilateral Distance:    Right Eye Near:   Left Eye Near:    Bilateral Near:     Physical Exam Vitals reviewed.  Constitutional:      General: She is not in acute distress.    Appearance: She is not toxic-appearing.  Musculoskeletal:     Comments: There is some swelling of the right fifth toe.  And some ecchymosis.  It is not angulated at this time  Neurological:     Mental Status: She is alert and oriented to person, place, and time.  Psychiatric:        Behavior: Behavior normal.      UC Treatments / Results  Labs (all labs ordered are listed, but only abnormal results are displayed) Labs Reviewed - No data to display  EKG   Radiology DG Toe 5th Right  Result Date: 03/20/2022 CLINICAL DATA:  Pain and swelling.  Injury. EXAM: RIGHT FIFTH TOE COMPARISON:  No recent prior. FINDINGS: Nondisplaced fracture of the base of the proximal phalanx of the right fifth digit noted. No radiopaque foreign body. IMPRESSION: Nondisplaced fracture of the base of the proximal phalanx of the right fifth digit. Electronically Signed   By: Marcello Moores  Register M.D.   On: 03/20/2022 10:32    Procedures Procedures (including critical care time)  Medications Ordered in UC Medications - No data to display  Initial Impression / Assessment and Plan / UC Course  I have reviewed the triage vital signs and the nursing notes.  Pertinent labs & imaging results that were available during my care of the patient were reviewed by me and considered in my medical decision making (see chart for details).      X-ray shows a fracture of the proximal  phalanx of the right fifth toe.  She is okay on PMP, and I am sending in some tramadol in case she needs it.  We are going to buddy tape the toes and have her in a postop shoe Final Clinical Impressions(s) / UC Diagnoses   Final diagnoses:  Closed nondisplaced fracture of proximal phalanx of lesser toe of right foot, initial encounter     Discharge Instructions      You have a broken bone in your right pinky toe.  Can elevate that foot in the next couple of days especially  You can take Aleve 1-2 every 12 hours as needed for pain  You can also take tramadol 50 mg 1 every 6 hours as needed for pain     ED Prescriptions     Medication Sig Dispense Auth. Provider   traMADol (ULTRAM) 50 MG tablet Take 1 tablet (50 mg total) by mouth every 6 (six) hours as needed. 12 tablet Chrishonda Hesch, Gwenlyn Perking, MD      I have reviewed the PDMP during this encounter.   Barrett Henle, MD 03/20/22 1043

## 2022-03-20 NOTE — Discharge Instructions (Addendum)
You have a broken bone in your right pinky toe.  Can elevate that foot in the next couple of days especially  You can take Aleve 1-2 every 12 hours as needed for pain  You can also take tramadol 50 mg 1 every 6 hours as needed for pain

## 2022-03-20 NOTE — ED Triage Notes (Signed)
Pt  reports injured RT small toe on Sat.

## 2022-04-03 ENCOUNTER — Ambulatory Visit (HOSPITAL_COMMUNITY)
Admission: EM | Admit: 2022-04-03 | Discharge: 2022-04-03 | Disposition: A | Discharge status: Home / Self Care | Payer: Medicare (Managed Care) | Attending: Family Medicine | Admitting: Family Medicine

## 2022-04-03 ENCOUNTER — Encounter (HOSPITAL_COMMUNITY): Payer: Self-pay

## 2022-04-03 DIAGNOSIS — T7840XA Allergy, unspecified, initial encounter: Secondary | ICD-10-CM | POA: Diagnosis not present

## 2022-04-03 DIAGNOSIS — W57XXXA Bitten or stung by nonvenomous insect and other nonvenomous arthropods, initial encounter: Secondary | ICD-10-CM | POA: Diagnosis not present

## 2022-04-03 MED ORDER — DEXAMETHASONE SODIUM PHOSPHATE 10 MG/ML IJ SOLN
INTRAMUSCULAR | Status: AC
  Filled 2022-04-03: qty 1

## 2022-04-03 MED ORDER — TRIAMCINOLONE ACETONIDE 0.025 % EX CREA
1.0000 | TOPICAL_CREAM | Freq: Three times a day (TID) | CUTANEOUS | 0 refills | Status: DC | PRN
Start: 2022-04-03 — End: 2023-02-26

## 2022-04-03 MED ORDER — DEXAMETHASONE SODIUM PHOSPHATE 10 MG/ML IJ SOLN
10.0000 mg | Freq: Once | INTRAMUSCULAR | Status: AC
  Administered 2022-04-03: 10 mg via INTRAMUSCULAR

## 2022-04-03 MED ORDER — DOXYCYCLINE HYCLATE 100 MG PO CAPS: 100.0000 mg | ORAL_CAPSULE | Freq: Two times a day (BID) | ORAL | 0 refills | Status: AC

## 2022-04-03 NOTE — ED Triage Notes (Signed)
Pt states bite by red ants to lt foot yesterday. C/o swelling, itching, and burning to lt foot. States took benadryl and advil today.

## 2022-04-03 NOTE — ED Provider Notes (Signed)
[place]    CSN: [id number] Arrival date & time: 04/03/22  1045      History   Chief Complaint Chief Complaint  Patient presents with   Insect Bite    HPI Deanna Knight is a 71 y.o. female.   HPI Patient presents for evaluation of left foot swelling, redness, and itching after being stung by red ants x 1 day ago.  Patient reports a previous reaction to black ants.  She endorses that the itching is radiating up her left leg.  She has been taking Benadryl and applying hydrocortisone cream without any relief in symptoms.  Denies any systemic symptoms of shortness of breath or difficulty swallowing. Past Medical History:  Diagnosis Date   Allergy    seasonal   Hypercholesteremia    Pneumothorax    Sarcoidosis    Vitamin D deficiency 09/2020    Patient Active Problem List   Diagnosis Date Noted   Pap test, as part of routine gynecological examination 01/09/2017   Hypercholesterolemia 10/11/2016   Hyperglycemia 10/11/2016   Blurred vision, left eye 10/11/2016    Past Surgical History:  Procedure Laterality Date   LUNG SURGERY     denies surgery 03/19/19   TUBAL LIGATION      OB History   No obstetric history on file.      Home Medications    Prior to Admission medications   Medication Sig Start Date End Date Taking? Authorizing Provider  doxycycline (VIBRAMYCIN) 100 MG capsule Take 1 capsule (100 mg total) by mouth 2 (two) times daily for 5 days. 04/03/22 04/08/22 Yes Scot Jun, FNP  triamcinolone (KENALOG) 0.025 % cream Apply 1 Application topically 3 (three) times daily as needed. 04/03/22  Yes Scot Jun, FNP  albuterol (VENTOLIN HFA) 108 (90 Base) MCG/ACT inhaler Inhale 2 puffs into the lungs every 6 (six) hours as needed for wheezing or shortness of breath. Dispense with aerochamber 10/21/20   Dorena Dew, FNP  cephALEXin (KEFLEX) 500 MG capsule Take 1 capsule (500 mg total) by mouth 3 (three) times daily. 06/18/21   Raspet,  Derry Skill, PA-C  diphenhydrAMINE (BENYLIN) 12.5 MG/5ML syrup Take 2.5 mLs (6.25 mg total) by mouth 4 (four) times daily as needed for allergies. 11/12/17   Dorena Dew, FNP  fexofenadine (ALLEGRA) 180 MG tablet Take 180 mg by mouth daily as needed for allergies or rhinitis.    [provider]  Multiple Vitamins-Minerals (CENTRUM ADULTS) TABS Take 1 tablet by mouth daily.    [provider]  naproxen sodium (ALEVE) 220 MG tablet Take 220 mg by mouth daily as needed (pain).    [provider]  NON FORMULARY Place 1 tablet under the tongue daily. Boiron    [provider]  Phenyleph-Doxylamine-DM-APAP (NYQUIL SEVERE COLD/FLU) 5-6.25-10-325 MG/15ML LIQD Take 15 mLs by mouth every 6 (six) hours as needed (cough).    [provider]  PREVIDENT 0.2 % SOLN Take 5 mLs by mouth as directed. Swish and spit 10/21/20   [provider]  Pseudoeph-Doxylamine-DM-APAP (DAYQUIL/NYQUIL COLD/FLU RELIEF PO) Take 2 tablets by mouth every 6 (six) hours as needed (cold symptoms).    [provider]  traMADol (ULTRAM) 50 MG tablet Take 1 tablet (50 mg total) by mouth every 6 (six) hours as needed. 03/20/22   Barrett Henle, MD  Vitamin D, Ergocalciferol, (DRISDOL) 1.25 MG (50000 UNIT) CAPS capsule Take 1 capsule (50,000 Units total) by mouth every 7 (seven) days. Patient not taking:  Reported on 01/27/2021 10/06/20   Azzie Glatter, FNP    Family History Family History  Problem Relation Age of Onset   Stroke Mother    Heart disease Mother    Heart disease Father    Stroke Sister    Heart disease Sister    Leukemia Sister    Stroke Brother    Heart disease Brother    Lung cancer Paternal Aunt    Colon cancer Neg Hx    Colon polyps Neg Hx    Esophageal cancer Neg Hx    Rectal cancer Neg Hx    Stomach cancer Neg Hx     Social History Social History   Tobacco Use   Smoking status: Former    Years: 1.00    Types: Cigarettes   Smokeless  tobacco: Never  Vaping Use   Vaping Use: Never used  Substance Use Topics   Alcohol use: Yes    Comment: wine holidays   Drug use: No     Allergies   Elemental sulfur and Sulfa antibiotics   Review of Systems Review of Systems Pertinent negatives listed in HPI  Physical Exam Triage Vital Signs ED Triage Vitals [04/03/22 1159]  Enc Vitals Group     BP (!) 142/74     Pulse Rate 81     Resp 18     Temp 98.7 F (37.1 C)     Temp Source Oral     SpO2 95 %     Weight      Height      Head Circumference      Peak Flow      Pain Score 6     Pain Loc      Pain Edu?      Excl. in [place]?    No data found.  Updated Vital Signs BP (!) 142/74 (BP Location: Left Arm)   Pulse 81   Temp 98.7 F (37.1 C) (Oral)   Resp 18   SpO2 95%   Visual Acuity Right Eye Distance:   Left Eye Distance:   Bilateral Distance:    Right Eye Near:   Left Eye Near:    Bilateral Near:     Physical Exam Vitals reviewed.  Constitutional:      Appearance: Normal appearance.  Cardiovascular:     Rate and Rhythm: Normal rate and regular rhythm.  Pulmonary:     Effort: Pulmonary effort is normal.     Breath sounds: Normal breath sounds.  Feet:     Left foot:     Skin integrity: Blister, erythema and warmth present.     Comments: Fine maculopapular rash diffusely spread on left foot migrating up the lower left leg Skin:    Capillary Refill: Capillary refill takes less than 2 seconds.  Neurological:     General: No focal deficit present.     Mental Status: She is alert and oriented to person, place, and time.  Psychiatric:        Mood and Affect: Mood normal.        Behavior: Behavior normal.      UC Treatments / Results  Labs (all labs ordered are listed, but only abnormal results are displayed) Labs Reviewed - No data to display  EKG   Radiology No results found.  Procedures Procedures (including critical care time)  Medications Ordered in UC Medications  dexamethasone  (DECADRON) injection 10 mg (10 mg Intramuscular Given 04/03/22 1300)    Initial Impression / Assessment  and Plan / UC Course  I have reviewed the triage vital signs and the nursing notes.  Pertinent labs & imaging results that were available during my care of the patient were reviewed by me and considered in my medical decision making (see chart for details).    Acute allergic reaction involving the left lower extremity Decadron IM given here in clinic. Apply Kenalog cream 3 times daily as needed until itching and irritation resolves.  Doxycycline twice daily for 5 days for prophylaxis against cellulitis as patient is demonstrating some blistering and swelling at the site of the ant stings.  Strict return precautions given if any of her symptoms worsen or do not readily improve. Final Clinical Impressions(s) / UC Diagnoses   Final diagnoses:  Bug bite with infection, initial encounter  Allergic reaction, initial encounter     Discharge Instructions      You received a Decadron injection which is a steroid injection to stop the allergic response which is causing the itching and irritation caused by the ant stings. I prescribed triamcinolone cream which is a topical steroid cream apply to areas of irritation up to 3 times daily until rash resolves. I am also placing on doxycycline which is an antibiotic twice daily for total 5 days to prevent infection. If any of your symptoms worsen or do not readily improve with prescribed medication return for evaluation     ED Prescriptions     Medication Sig Dispense Auth. Provider   triamcinolone (KENALOG) 0.025 % cream Apply 1 Application topically 3 (three) times daily as needed. 160 g Scot Jun, FNP   doxycycline (VIBRAMYCIN) 100 MG capsule Take 1 capsule (100 mg total) by mouth 2 (two) times daily for 5 days. 10 capsule Scot Jun, FNP      PDMP not reviewed this encounter.   Scot Jun, FNP 04/05/22 [phone]

## 2022-04-03 NOTE — Discharge Instructions (Addendum)
You received a Decadron injection which is a steroid injection to stop the allergic response which is causing the itching and irritation caused by the ant stings. I prescribed triamcinolone cream which is a topical steroid cream apply to areas of irritation up to 3 times daily until rash resolves. I am also placing on doxycycline which is an antibiotic twice daily for total 5 days to prevent infection. If any of your symptoms worsen or do not readily improve with prescribed medication return for evaluation

## 2022-04-24 ENCOUNTER — Encounter: Payer: Self-pay | Admitting: Gastroenterology

## 2022-08-30 ENCOUNTER — Telehealth: Payer: Self-pay | Admitting: Nurse Practitioner

## 2022-08-30 NOTE — Telephone Encounter (Signed)
Left message for patient to call back and schedule Medicare Annual Wellness Visit (AWV).   Please offer to do virtually or by telephone.   Last AWV:  10/08/2017   Schedule at any time with [place]   30 minute appointment for Virtual or phone  45 minute appointment for Initial virtual/phone  Any questions, please contact me at [phone]   Thank you,   [facility]  Ambulatory Clinical Support for Care Management [place] Are. We Are. [facility] ??[phone] or ??[phone]

## 2022-09-02 IMAGING — MG MM DIGITAL SCREENING BILAT W/ TOMO AND CAD
6 of 12 series · 6 of 36 positions shown · non-contrast
Comparison: None.

CLINICAL DATA: Screening.

EXAM:
DIGITAL SCREENING BILATERAL MAMMOGRAM WITH TOMOSYNTHESIS AND CAD

[L MLO synth-2D (1 of 2)]
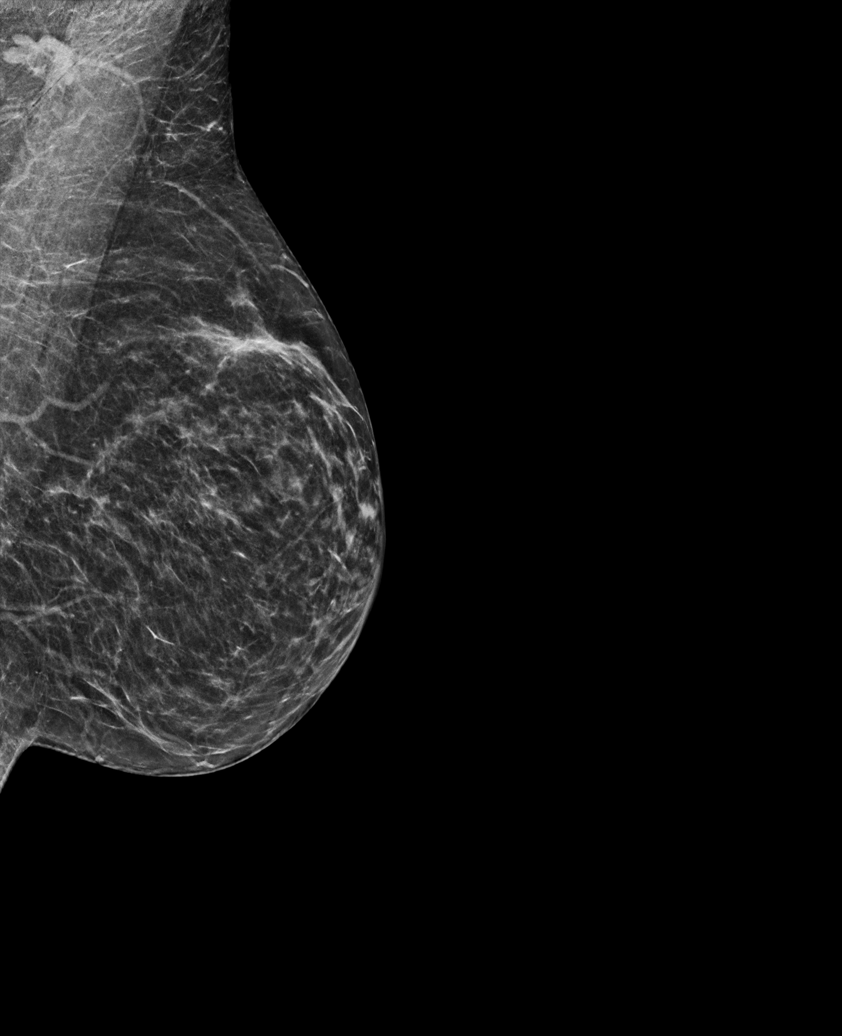

[R MLO synth-2D (1 of 2)]
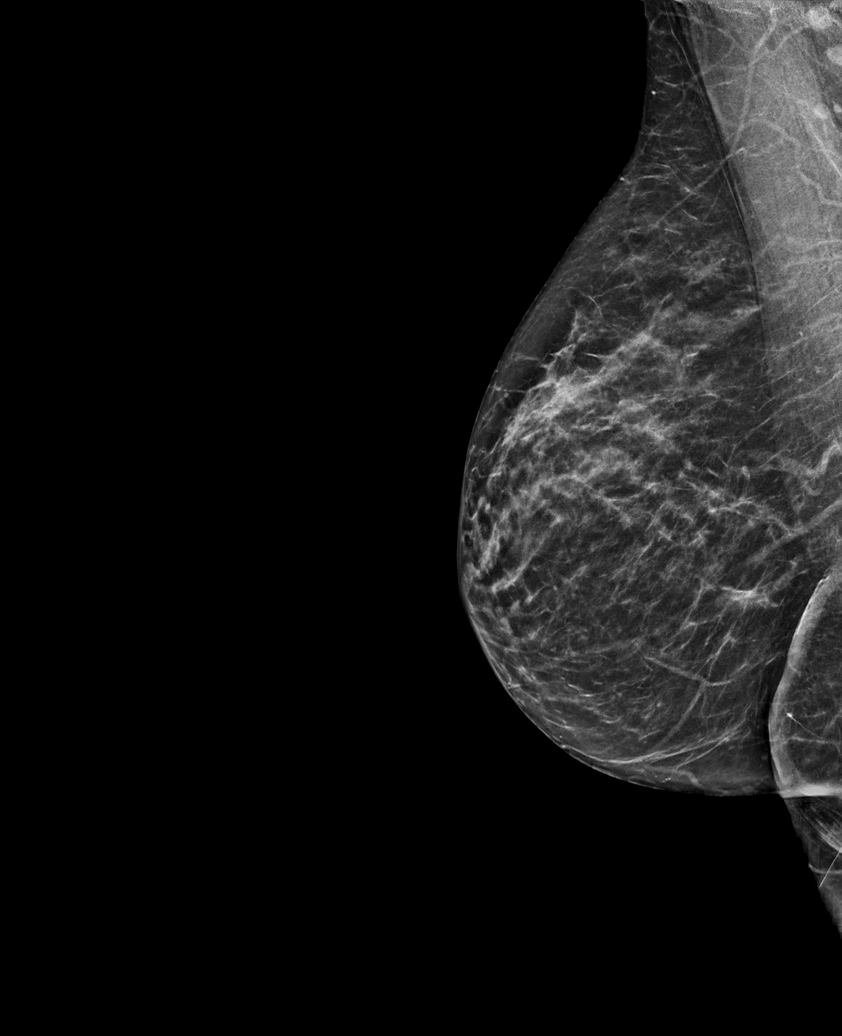

[L MLO synth-2D (2 of 2)]
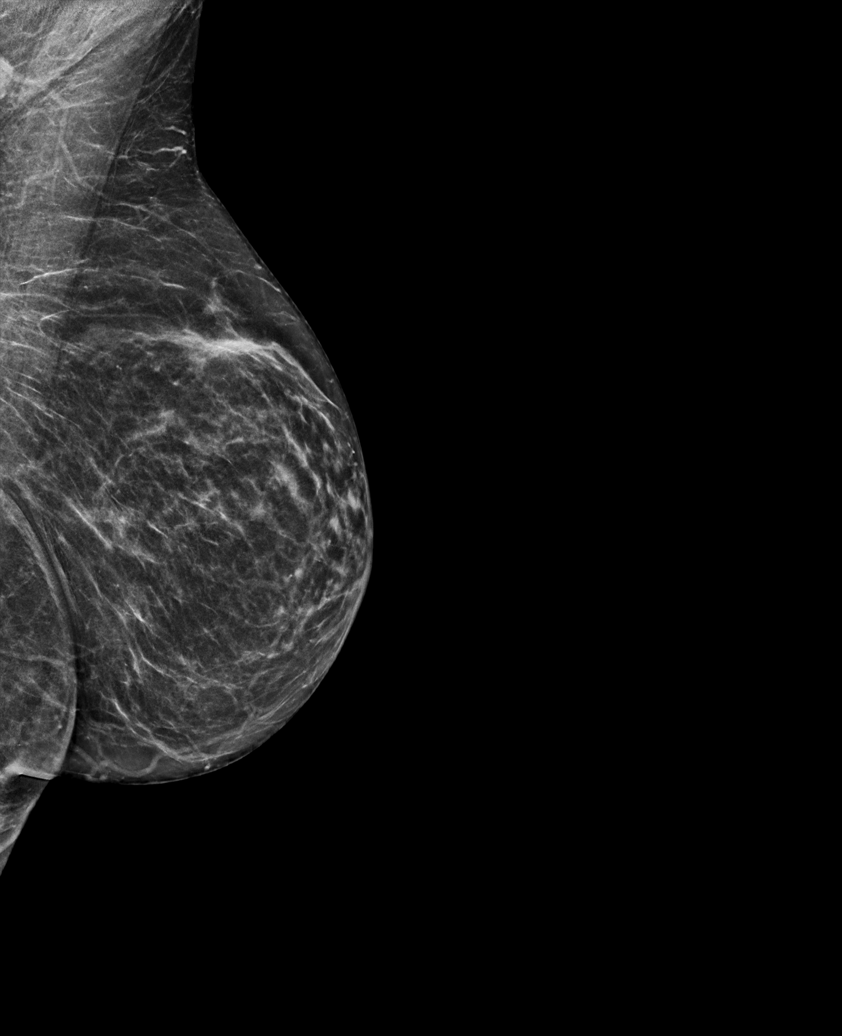

[L CC synth-2D]
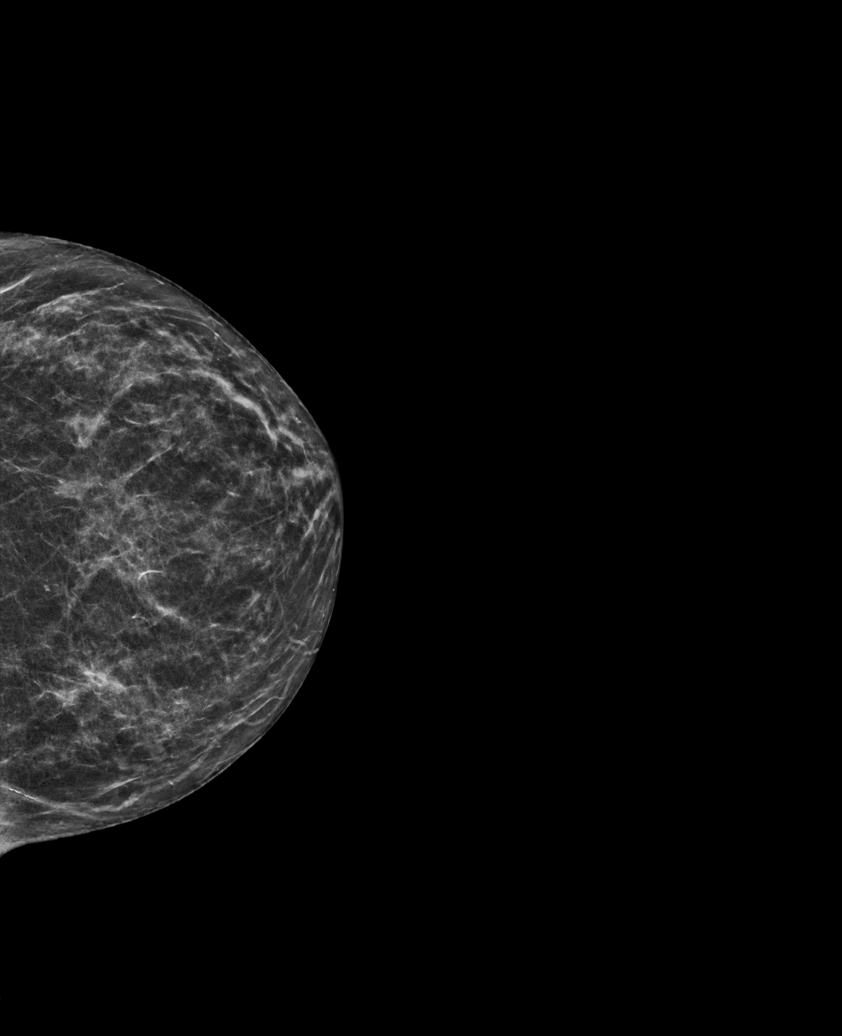

[R MLO synth-2D (2 of 2)]
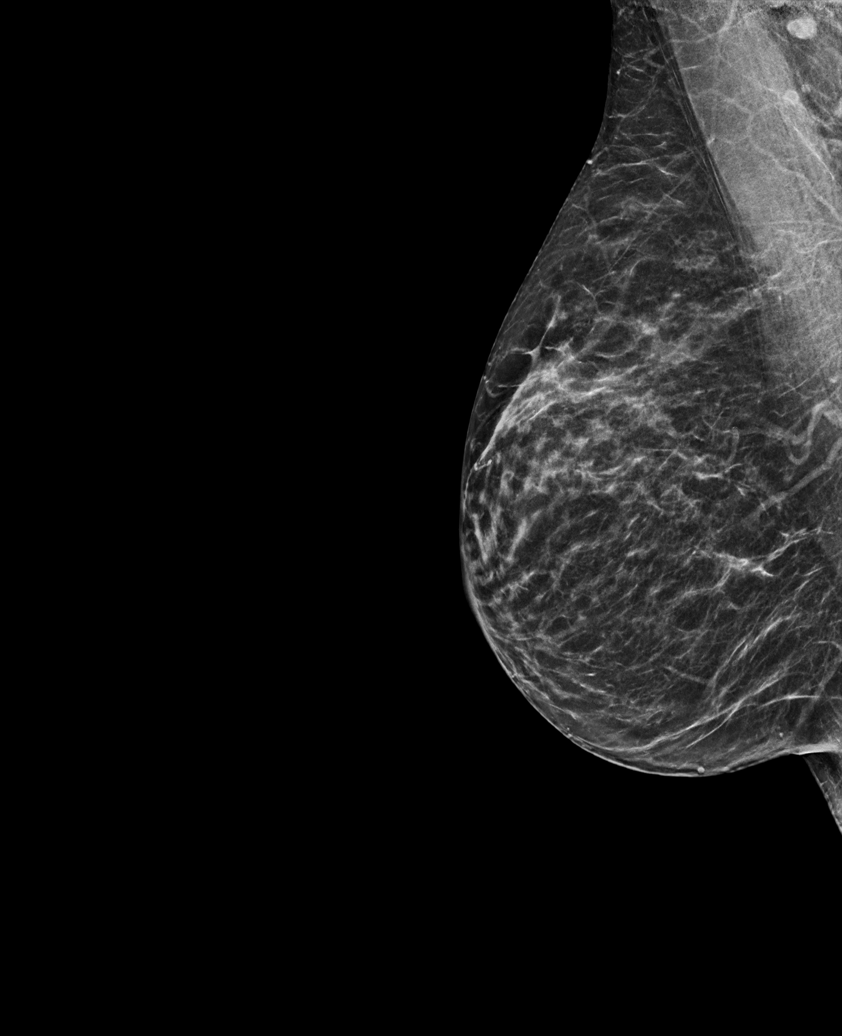

[R CC synth-2D]
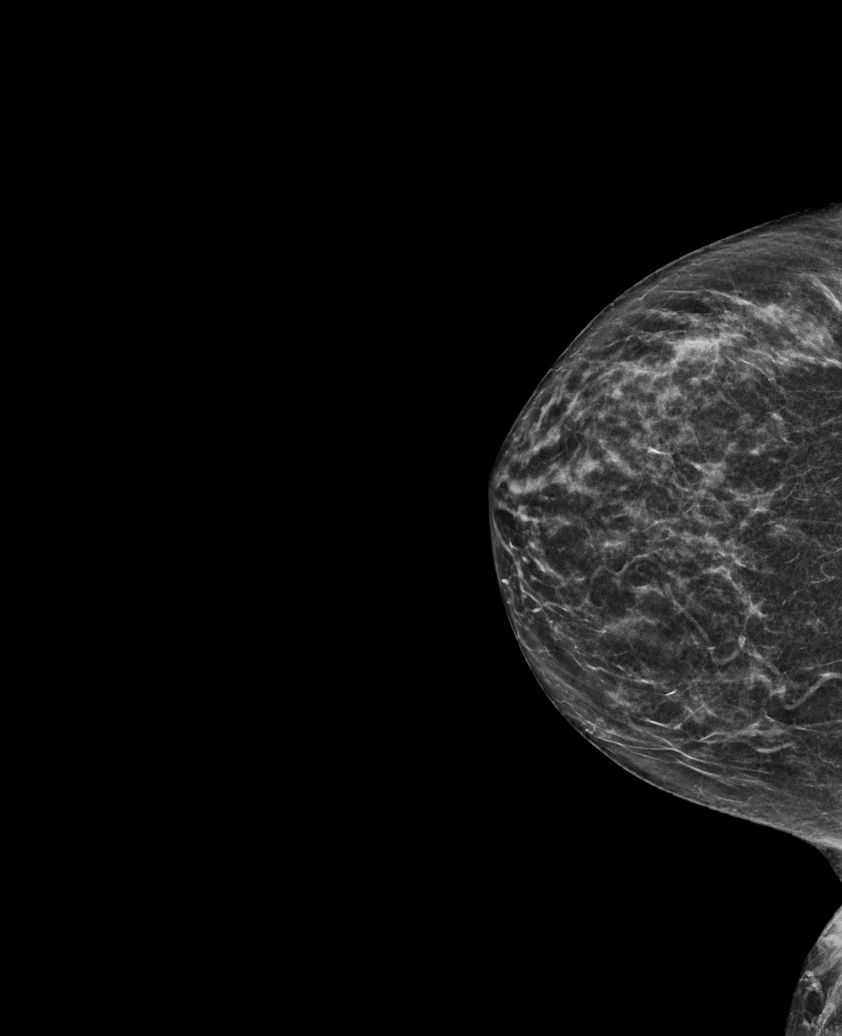

[6 of 36 positions shown; findings below may reference images not displayed]

ACR Breast Density Category b: There are scattered areas of
fibroglandular density.
FINDINGS: In the left breast, 2 possible asymmetries warrant further
evaluation. In the right breast, no findings suspicious for
malignancy.
IMPRESSION: Further evaluation is suggested for 2 possible asymmetries in the
left breast.

RECOMMENDATION:
Diagnostic mammogram and possibly ultrasound of the left breast.
(Code:[id number])

The patient will be contacted regarding the findings, and additional
imaging will be scheduled.

BI-RADS CATEGORY  0: Incomplete. Need additional imaging evaluation
and/or prior mammograms for comparison.

## 2022-12-20 IMAGING — CR DG CHEST 2V
2 series · 2 of 2 positions shown · non-contrast
Comparison: 10/05/2017

CLINICAL DATA: Chest pain

EXAM:
CHEST - 2 VIEW

[w chest pa]
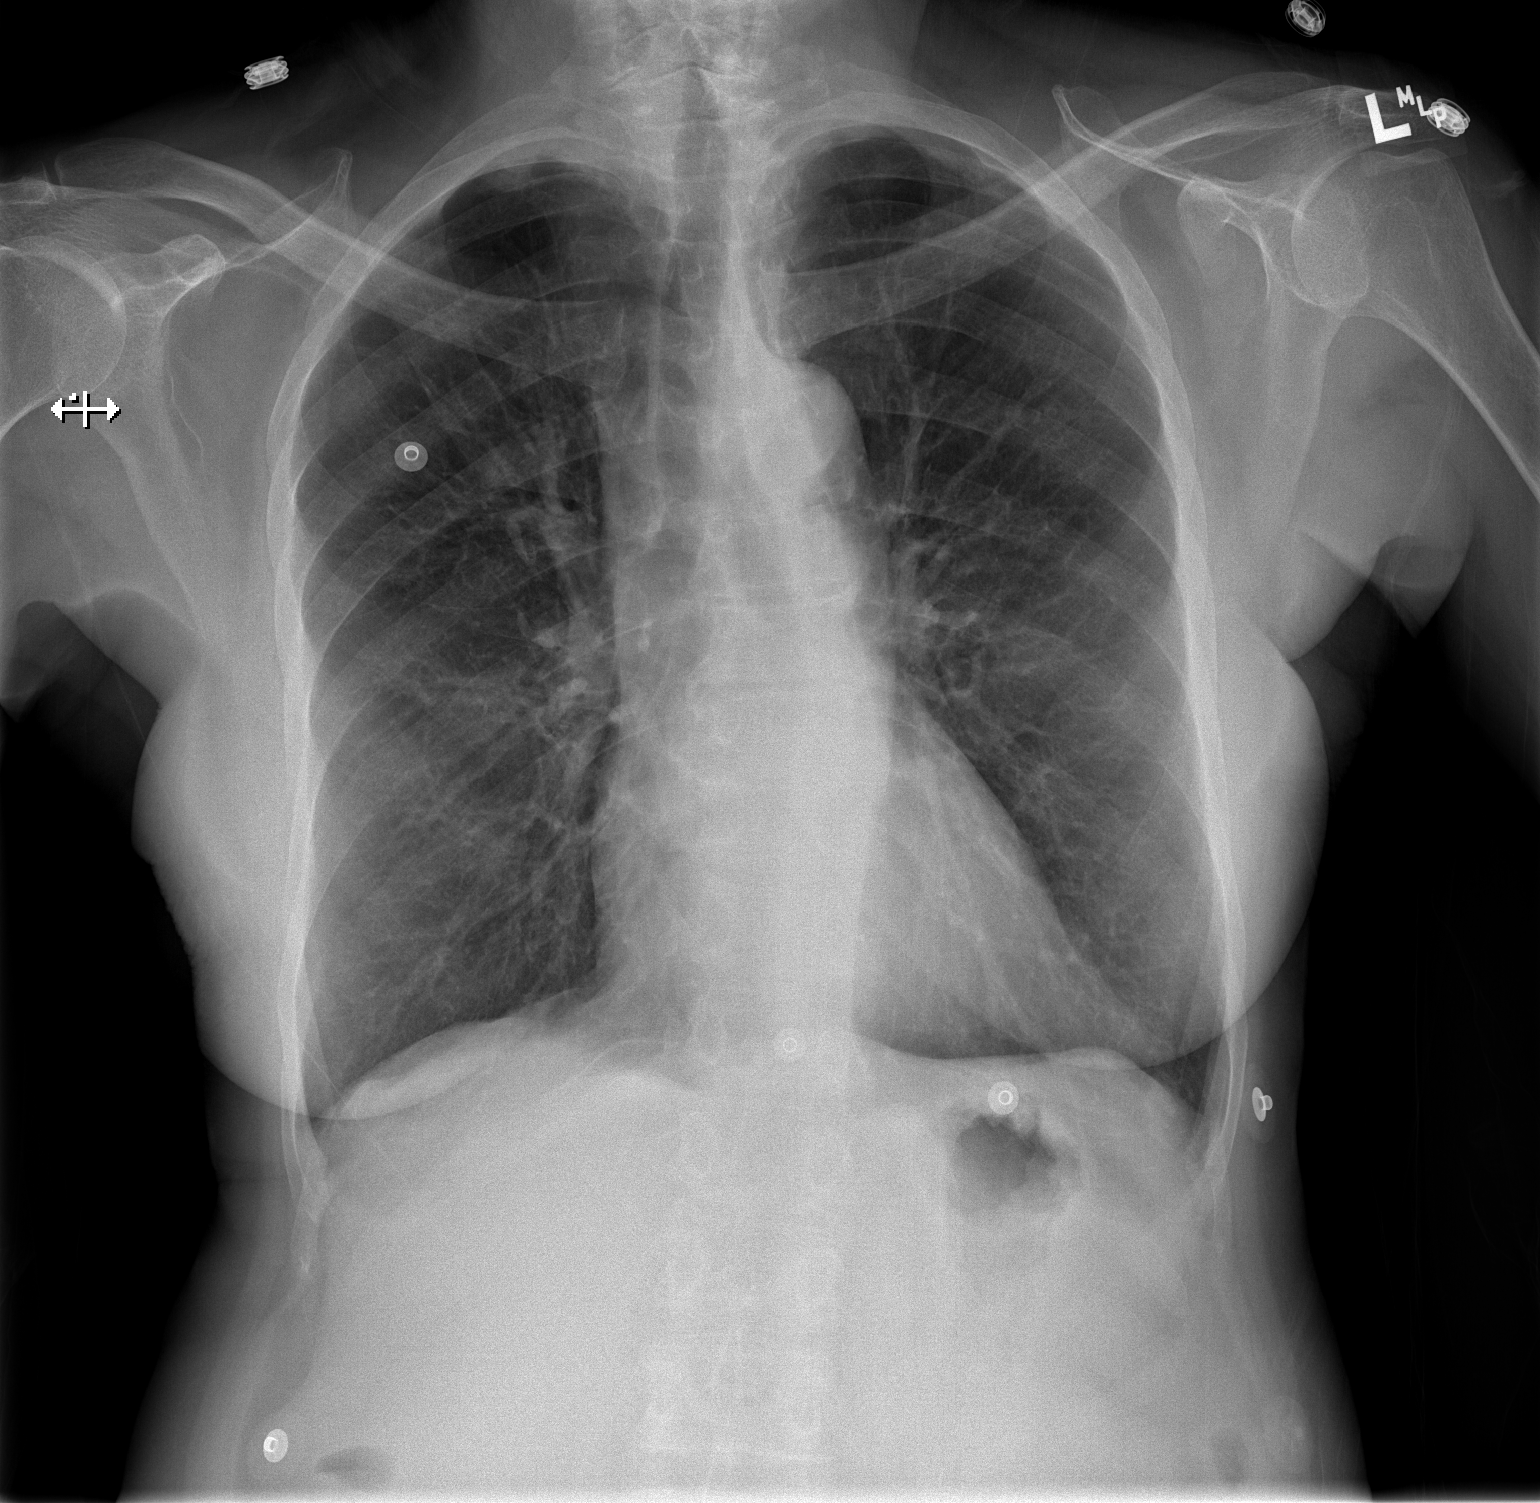

[w chest lat]
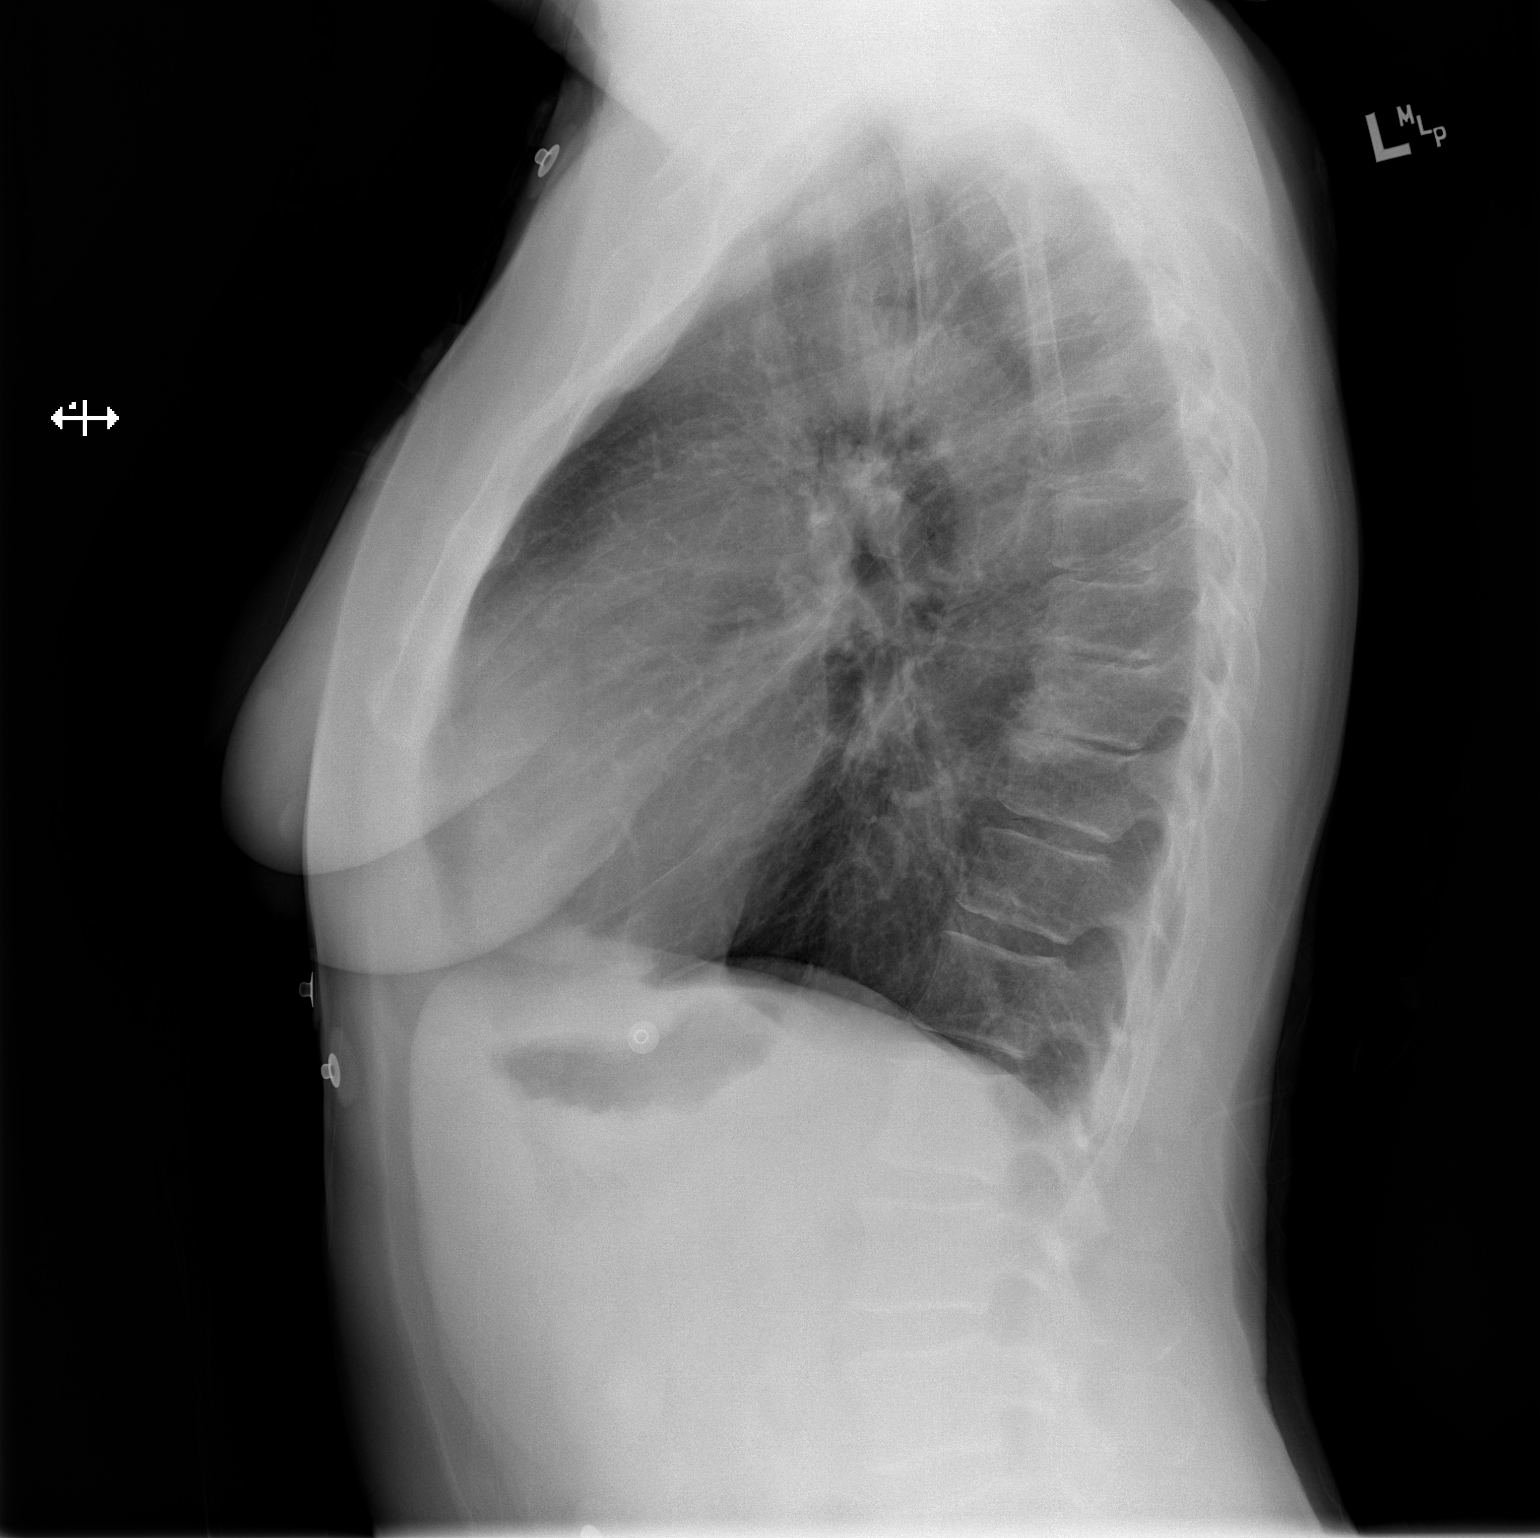

[2 of 2 positions shown; findings below may reference images not displayed]

FINDINGS: Heart and mediastinal contours are within normal limits. No focal
opacities or effusions. No acute bony abnormality.
IMPRESSION: No active cardiopulmonary disease.

## 2023-02-26 ENCOUNTER — Encounter (HOSPITAL_COMMUNITY): Payer: Self-pay

## 2023-02-26 ENCOUNTER — Ambulatory Visit (HOSPITAL_COMMUNITY)
Admission: EM | Admit: 2023-02-26 | Discharge: 2023-02-26 | Disposition: A | Discharge status: Home / Self Care | Payer: Medicare (Managed Care) | Attending: Emergency Medicine | Admitting: Emergency Medicine

## 2023-02-26 DIAGNOSIS — L237 Allergic contact dermatitis due to plants, except food: Secondary | ICD-10-CM

## 2023-02-26 MED ORDER — METHYLPREDNISOLONE ACETATE 80 MG/ML IJ SUSP
60.0000 mg | Freq: Once | INTRAMUSCULAR | Status: AC
  Administered 2023-02-26: 60 mg via INTRAMUSCULAR

## 2023-02-26 MED ORDER — METHYLPREDNISOLONE 4 MG PO TBPK: ORAL_TABLET | ORAL | 0 refills | Status: AC

## 2023-02-26 MED ORDER — METHYLPREDNISOLONE ACETATE 80 MG/ML IJ SUSP
INTRAMUSCULAR | Status: AC
  Filled 2023-02-26: qty 1

## 2023-02-26 NOTE — Discharge Instructions (Signed)
I have enclosed some information about poison oak dermatitis that I hope you find helpful.  I strongly recommend that you continue using calamine lotion 4 times daily as needed.  You may also consider taking oatmeal baths 1-2 times daily.  [employer] makes an oatmeal bath powder specifically for this purpose.  You received an injection of methylprednisolone during your visit today to reduce inflammation of your skin and hopefully calm the itching and redness.  I have also provided you with a prescription for an oral methylprednisolone that you will take for the next 6 days.  I strongly recommend that you take it in the morning with food.  You will take 1 row tablets with each dose, there are 6 rows.  Thank you for visiting [facility] today.  We appreciate the opportunity to participate in your care.

## 2023-02-26 NOTE — ED Provider Notes (Signed)
[facility]    CSN: [id number] Arrival date & time: 02/26/23  1002    HISTORY   Chief Complaint  Patient presents with   Rash   HPI Deanna Knight is a pleasant, 72 y.o. female who presents to urgent care today. Patient states that 5 days ago she was working in her yard pulling up brush that was choking her roses.  Patient states she was not aware that she was getting into poison oak but when she developed a rash 2 days ago on both of her forearms and her hands, remembered that her landlord pointed out that there was poison oak growing around the roses.  Patient states the rash is very itchy, red and blistering, particularly in between her fingers.  Patient states she has been using calamine lotion which provides meaningful relief.  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  The history is provided by the patient.   Past Medical History:  Diagnosis Date   Allergy    seasonal   Hypercholesteremia    Pneumothorax    Sarcoidosis    Vitamin D deficiency 09/2020   Patient Active Problem List   Diagnosis Date Noted   Pap test, as part of routine gynecological examination 01/09/2017   Hypercholesterolemia 10/11/2016   Hyperglycemia 10/11/2016   Blurred vision, left eye 10/11/2016   Past Surgical History:  Procedure Laterality Date   LUNG SURGERY     denies surgery 03/19/19   TUBAL LIGATION     OB History   No obstetric history on file.    Home Medications    Prior to Admission medications   Medication Sig Start Date End Date Taking? Authorizing Provider  methylPREDNISolone (MEDROL DOSEPAK) 4 MG TBPK tablet Take 24 mg on day 1, 20 mg on day 2, 16 mg on day 3, 12 mg on day 4, 8 mg on day 5, 4 mg on day 6.  Take all tablets in each row at once, do not spread tablets out throughout the day. 02/26/23  Yes Theadora Rama Scales, PA-C  albuterol (VENTOLIN HFA) 108 (90 Base) MCG/ACT inhaler Inhale 2 puffs into the lungs  every 6 (six) hours as needed for wheezing or shortness of breath. Dispense with aerochamber 10/21/20   Massie Maroon, FNP  diphenhydrAMINE (BENYLIN) 12.5 MG/5ML syrup Take 2.5 mLs (6.25 mg total) by mouth 4 (four) times daily as needed for allergies. 11/12/17   Massie Maroon, FNP  fexofenadine (ALLEGRA) 180 MG tablet Take 180 mg by mouth daily as needed for allergies or rhinitis.    [provider]  naproxen sodium (ALEVE) 220 MG tablet Take 220 mg by mouth daily as needed (pain).    [provider]  NON FORMULARY Place 1 tablet under the tongue daily. Boiron    [provider]    Family History Family History  Problem Relation Age of Onset   Stroke Mother    Heart disease Mother    Heart disease Father    Stroke Sister    Heart disease Sister    Leukemia Sister    Stroke Brother    Heart disease Brother    Lung cancer Paternal Aunt    Colon cancer Neg Hx    Colon polyps Neg Hx    Esophageal cancer Neg Hx    Rectal cancer Neg Hx    Stomach cancer Neg Hx    Social History Social History   Tobacco Use  Smoking status: Former    Years: 1    Types: Cigarettes   Smokeless tobacco: Never  Vaping Use   Vaping Use: Never used  Substance Use Topics   Alcohol use: Yes    Comment: wine holidays   Drug use: No   Allergies   Elemental sulfur and Sulfa antibiotics  Review of Systems Review of Systems Pertinent findings revealed after performing a 14 point review of systems has been noted in the history of present illness.  Physical Exam Vital Signs BP 127/72 (BP Location: Left Arm)   Pulse 67   Temp 98.2 F (36.8 C) (Oral)   Resp 18   SpO2 98%   No data found.  Physical Exam Vitals and nursing note reviewed.  Constitutional:      General: She is not in acute distress.    Appearance: Normal appearance.  HENT:     Head: Normocephalic and atraumatic.  Eyes:     Pupils: Pupils are equal, round, and reactive to light.  Cardiovascular:      Rate and Rhythm: Normal rate and regular rhythm.  Pulmonary:     Effort: Pulmonary effort is normal.     Breath sounds: Normal breath sounds.  Musculoskeletal:        General: Normal range of motion.     Cervical back: Normal range of motion and neck supple.  Skin:    General: Skin is warm and dry.     Findings: Rash (Atopy concerning for exposure to poison oak on back of both hands and forearms without signs of excoriation, superficial infection.) present.  Neurological:     General: No focal deficit present.     Mental Status: She is alert and oriented to person, place, and time. Mental status is at baseline.  Psychiatric:        Mood and Affect: Mood normal.        Behavior: Behavior normal.        Thought Content: Thought content normal.        Judgment: Judgment normal.     Visual Acuity Right Eye Distance:   Left Eye Distance:   Bilateral Distance:    Right Eye Near:   Left Eye Near:    Bilateral Near:     UC Couse / Diagnostics / Procedures:     Radiology No results found.  Procedures Procedures (including critical care time) EKG  Pending results:  Labs Reviewed - No data to display  Medications Ordered in UC: Medications  methylPREDNISolone acetate (DEPO-MEDROL) injection 60 mg (has no administration in time range)    UC Diagnoses / Final Clinical Impressions(s)   I have reviewed the triage vital signs and the nursing notes.  Pertinent labs & imaging results that were available during my care of the patient were reviewed by me and considered in my medical decision making (see chart for details).    Final diagnoses:  Poison oak dermatitis   Patient provided with steroid injection during her visit today and advised to continue oral methylprednisolone for the next 6 days.  Patient also advised to continue calamine lotion 4 times daily.  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  Patient was in agreement with plan.  Conservative care recommended.  Return precautions advised.  Please  see discharge instructions below for details of plan of care as provided to patient. ED Prescriptions     Medication Sig Dispense Auth. Provider   methylPREDNISolone (MEDROL DOSEPAK) 4 MG TBPK tablet Take 24 mg on day 1, 20 mg on day 2, 16 mg on day 3, 12 mg on day 4, 8 mg on day 5, 4 mg on day 6.  Take all tablets in each row at once, do not spread tablets out throughout the day. 21 tablet Theadora Rama Scales, PA-C      PDMP not reviewed this encounter.  Pending results:  Labs Reviewed - No data to display  Discharge Instructions:   Discharge Instructions      I have enclosed some information about poison oak dermatitis that I hope you find helpful.  I strongly recommend that you continue using calamine lotion 4 times daily as needed.  You may also consider taking oatmeal baths 1-2 times daily.  [employer] makes an oatmeal bath powder specifically for this purpose.  You received an injection of methylprednisolone during your visit today to reduce inflammation of your skin and hopefully calm the itching and redness.  I have also provided you with a prescription for an oral methylprednisolone that you will take for the next 6 days.  I strongly recommend that you take it in the morning with food.  You will take 1 row tablets with each dose, there are 6 rows.  Thank you for visiting [facility] today.  We appreciate the opportunity to participate in your care.    Disposition Upon Discharge:  Condition: stable for discharge home  Patient presented with an acute illness with associated systemic symptoms and significant discomfort requiring urgent management. In my opinion, this is a condition that a prudent lay person (someone who possesses an average  knowledge of health and medicine) may potentially expect to result in complications if not addressed urgently such as respiratory distress, impairment of bodily function or dysfunction of bodily organs.   Routine symptom specific, illness specific and/or disease specific instructions were discussed with the patient and/or caregiver at length.   As such, the patient has been evaluated and assessed, work-up was performed and treatment was provided in alignment with urgent care protocols and evidence based medicine.  Patient/parent/caregiver has been advised that the patient may require follow up for further testing and treatment if the symptoms continue in spite of treatment, as clinically indicated and appropriate.  Patient/parent/caregiver has been advised to return to the [facility] or PCP if no better; to PCP or the Emergency Department if new signs and symptoms develop, or if the current signs or symptoms continue to change or worsen for further workup, evaluation and treatment as clinically indicated and appropriate  The patient will follow up with their current PCP if and as advised. If the patient does not currently have a PCP we will assist them in obtaining one.   The patient may need specialty follow up if the symptoms continue, in spite of conservative treatment and management, for further workup, evaluation, consultation and treatment as clinically indicated and appropriate.  Patient/parent/caregiver verbalized understanding and agreement of plan as discussed.  All questions were addressed during visit.  Please see discharge instructions below for further details of plan.  This office note has been dictated using Teaching laboratory technician.  Unfortunately, this method of dictation can sometimes lead to typographical or grammatical errors.  I apologize for your inconvenience in advance if this occurs.  Please do  not hesitate to reach out to me if clarification is needed.      Theadora Rama Scales, PA-C 02/26/23 1058

## 2023-02-26 NOTE — ED Triage Notes (Signed)
Pt is here with 2 day history of rash on hands and arms. Pt stated she been working in the yard.

## 2023-05-25 ENCOUNTER — Emergency Department (HOSPITAL_COMMUNITY)
Admission: EM | Admit: 2023-05-25 | Discharge: 2023-05-25 | Disposition: A | Discharge status: Home / Self Care | Payer: No Typology Code available for payment source | Attending: Emergency Medicine | Admitting: Emergency Medicine

## 2023-05-25 ENCOUNTER — Encounter (HOSPITAL_COMMUNITY): Payer: Self-pay

## 2023-05-25 ENCOUNTER — Other Ambulatory Visit: Payer: Self-pay

## 2023-05-25 DIAGNOSIS — W57XXXA Bitten or stung by nonvenomous insect and other nonvenomous arthropods, initial encounter: Secondary | ICD-10-CM | POA: Diagnosis not present

## 2023-05-25 DIAGNOSIS — S20369A Insect bite (nonvenomous) of unspecified front wall of thorax, initial encounter: Secondary | ICD-10-CM | POA: Diagnosis present

## 2023-05-25 MED ORDER — CEPHALEXIN 500 MG PO CAPS: 500.0000 mg | ORAL_CAPSULE | Freq: Two times a day (BID) | ORAL | 0 refills | Status: AC

## 2023-05-25 NOTE — Discharge Instructions (Signed)
Thank you for letting us take care of you today. I have sent Keflex to your pharmacy take as directed. Continue taking Benadryl at home for pruritus  Return to the emergency department precautions discussed to include but not limited to difficulty breathing, tingling throat, difficulty swallowing, shortness of breath, chest pain

## 2023-05-25 NOTE — ED Triage Notes (Addendum)
Pt states she was outside at work and she felt a stinging under her shirt yesterday. Pt has a red raised bump on her chest. Pt denies any sx at this time. Pt states she took benadryl at 1230 today

## 2023-05-25 NOTE — ED Provider Notes (Signed)
[place] [facility] AT [facility] Provider Note   CSN: [id number] Arrival date & time: 05/25/23  1755     History  Chief Complaint  Patient presents with   Insect Bite    Deanna Knight is a 72 y.o. female past medical history of sarcoidosis presents to the emergency department following a black ant bite on her anterior chest yesterday at 1000.  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  She took 25 mg of Benadryl today at 1200 for mild swelling and pruritus.  She denies chest pain, shortness of breath, difficulty swallowing, paresthesia of throat.  Of note she has been seen for black ant bites in the ED in the past on 06/18/2021 and 04/03/2022.  She has not had anaphylaxis symptoms nor shortness of breath nor difficulty swallowing following ant bites.    HPI     Home Medications Prior to Admission medications   Medication Sig Start Date End Date Taking? Authorizing Provider  cephALEXin (KEFLEX) 500 MG capsule Take 1 capsule (500 mg total) by mouth 2 (two) times daily for 7 days. 05/25/23 06/01/23 Yes Judithann Sheen, PA  albuterol (VENTOLIN HFA) 108 (90 Base) MCG/ACT inhaler Inhale 2 puffs into the lungs every 6 (six) hours as needed for wheezing or shortness of breath. Dispense with aerochamber 10/21/20   Massie Maroon, FNP  diphenhydrAMINE (BENYLIN) 12.5 MG/5ML syrup Take 2.5 mLs (6.25 mg total) by mouth 4 (four) times daily as needed for allergies. 11/12/17   Massie Maroon, FNP  fexofenadine (ALLEGRA) 180 MG tablet Take 180 mg by mouth daily as needed for allergies or rhinitis.    [provider]  methylPREDNISolone (MEDROL DOSEPAK) 4 MG TBPK tablet Take 24 mg on day 1, 20 mg on day 2, 16 mg on day 3, 12 mg on day 4, 8 mg on day 5, 4 mg on day 6.  Take all tablets in each row at once, do not spread tablets out throughout the day. 02/26/23   Theadora Rama  Scales, PA-C  naproxen sodium (ALEVE) 220 MG tablet Take 220 mg by mouth daily as needed (pain).    [provider]  NON FORMULARY Place 1 tablet under the tongue daily. Boiron    [provider]      Allergies    Elemental sulfur and Sulfa antibiotics    Review of Systems   Review of Systems  Constitutional:  Negative for chills, fatigue and fever.  Respiratory:  Negative for cough, chest tightness, shortness of breath and wheezing.   Cardiovascular:  Negative for chest pain and palpitations.  Gastrointestinal:  Negative for abdominal pain, constipation, diarrhea, nausea and vomiting.  Skin:        Mild swelling and pruritus to anterior chest    Neurological:  Negative for dizziness, seizures, weakness, light-headedness, numbness and headaches.    Physical Exam Updated Vital Signs BP 117/83 (BP Location: Right Arm)   Pulse 78   Temp 97.9 F (36.6 C) (Oral)   Resp 16   Ht 5' 6.5" (1.689 m)   Wt 58.5 kg   SpO2 91%   BMI 20.50 kg/m  Physical Exam Vitals and nursing note reviewed.  Constitutional:      General: She is not in acute distress.    Appearance: Normal appearance.  HENT:     Head: Normocephalic and atraumatic.  Eyes:  Conjunctiva/sclera: Conjunctivae normal.  Cardiovascular:     Rate and Rhythm: Normal rate.  Pulmonary:     Effort: Pulmonary effort is normal. No respiratory distress.  Skin:    General: Skin is warm.     Capillary Refill: Capillary refill takes less than 2 seconds.     Coloration: Skin is not jaundiced or pale.     Comments: One papule noted to anterior chest No surrounding erythema, warmth  Neurological:     Mental Status: She is alert. Mental status is at baseline.     ED Results / Procedures / Treatments   Labs (all labs ordered are listed, but only abnormal results are displayed) Labs Reviewed - No data to display  EKG None  Radiology No results found.  Procedures Procedures    Medications Ordered  in ED Medications - No data to display  ED Course/ Medical Decision Making/ A&P                                 Medical Decision Making  Upon assessment, patient is resting comfortably in bed.  No physical signs of shortness of breath, distress.  She denies difficulty breathing, paresthesia to throat, trouble swallowing.  She reports that she felt pain today then started itching upon arrival to emergency department.  I do not feel like lab work is required for diagnosis and treatment plan.  Has had Keflex for mild infection secondary to ant bites in the past and did well so will prescribe Keflex for mild skin infection. Discussed continuing to take Benadryl at home for pruritus.  Patient stable for discharge.  Follow-up with primary care provider for routine medical maintenance and medication management.  Return to the emergency department precautions discussed to include but not limited to difficulty breathing, tingling throat, difficulty swallowing, shortness of breath, chest pain.        Final Clinical Impression(s) / ED Diagnoses Final diagnoses:  Insect bite, unspecified site, initial encounter    Rx / DC Orders ED Discharge Orders          Ordered    cephALEXin (KEFLEX) 500 MG capsule  2 times daily        05/25/23 2324              Judithann Sheen, PA 05/25/23 2328    Glynn Octave, MD 05/26/23 [phone]

## 2024-09-10 ENCOUNTER — Encounter: Payer: Self-pay | Admitting: Family

## 2024-09-10 ENCOUNTER — Other Ambulatory Visit: Payer: Self-pay | Admitting: Family

## 2024-09-10 DIAGNOSIS — N6489 Other specified disorders of breast: Secondary | ICD-10-CM

## 2024-09-10 DIAGNOSIS — R928 Other abnormal and inconclusive findings on diagnostic imaging of breast: Secondary | ICD-10-CM

## 2024-09-15 ENCOUNTER — Other Ambulatory Visit: Payer: Self-pay | Admitting: Family

## 2024-09-15 DIAGNOSIS — N6489 Other specified disorders of breast: Secondary | ICD-10-CM

## 2024-09-16 ENCOUNTER — Other Ambulatory Visit: Payer: Self-pay | Admitting: Family

## 2024-09-16 DIAGNOSIS — R928 Other abnormal and inconclusive findings on diagnostic imaging of breast: Secondary | ICD-10-CM

## 2024-09-16 DIAGNOSIS — N6489 Other specified disorders of breast: Secondary | ICD-10-CM

## 2024-09-18 ENCOUNTER — Ambulatory Visit
Admission: RE | Admit: 2024-09-18 | Discharge: 2024-09-18 | Disposition: A | Discharge status: Home / Self Care | Payer: Self-pay | Source: Ambulatory Visit | Attending: Family | Admitting: Family

## 2024-09-18 ENCOUNTER — Ambulatory Visit: Payer: Self-pay

## 2024-09-18 DIAGNOSIS — R928 Other abnormal and inconclusive findings on diagnostic imaging of breast: Secondary | ICD-10-CM
